# Patient Record
Sex: Female | Born: 1945 | Race: White | Hispanic: No | Marital: Single | Smoking: Former smoker
Health system: Southern US, Community
[De-identification: ages and names within clinical notes are randomized; demographics above are authoritative.]

## PROBLEM LIST (undated history)

## (undated) DIAGNOSIS — E785 Hyperlipidemia, unspecified: Secondary | ICD-10-CM

## (undated) DIAGNOSIS — K2270 Barrett's esophagus without dysplasia: Secondary | ICD-10-CM

## (undated) HISTORY — DX: Barrett's esophagus without dysplasia: K22.70

## (undated) HISTORY — DX: Hyperlipidemia, unspecified: E78.5

---

## 1998-05-20 HISTORY — PX: TOTAL HIP ARTHROPLASTY: SHX124

## 2015-06-27 DIAGNOSIS — D3130 Benign neoplasm of unspecified choroid: Secondary | ICD-10-CM | POA: Diagnosis not present

## 2015-06-27 DIAGNOSIS — H18419 Arcus senilis, unspecified eye: Secondary | ICD-10-CM | POA: Diagnosis not present

## 2015-06-27 DIAGNOSIS — H259 Unspecified age-related cataract: Secondary | ICD-10-CM | POA: Diagnosis not present

## 2015-10-27 DIAGNOSIS — F5101 Primary insomnia: Secondary | ICD-10-CM | POA: Diagnosis not present

## 2015-10-27 DIAGNOSIS — Z1211 Encounter for screening for malignant neoplasm of colon: Secondary | ICD-10-CM | POA: Diagnosis not present

## 2015-10-27 DIAGNOSIS — E785 Hyperlipidemia, unspecified: Secondary | ICD-10-CM | POA: Diagnosis not present

## 2015-10-27 DIAGNOSIS — Z139 Encounter for screening, unspecified: Secondary | ICD-10-CM | POA: Diagnosis not present

## 2015-10-27 DIAGNOSIS — E663 Overweight: Secondary | ICD-10-CM | POA: Diagnosis not present

## 2015-10-27 DIAGNOSIS — M150 Primary generalized (osteo)arthritis: Secondary | ICD-10-CM | POA: Diagnosis not present

## 2015-10-27 DIAGNOSIS — M949 Disorder of cartilage, unspecified: Secondary | ICD-10-CM | POA: Diagnosis not present

## 2015-10-27 DIAGNOSIS — M899 Disorder of bone, unspecified: Secondary | ICD-10-CM | POA: Diagnosis not present

## 2015-10-27 DIAGNOSIS — Z23 Encounter for immunization: Secondary | ICD-10-CM | POA: Diagnosis not present

## 2015-12-08 DIAGNOSIS — M899 Disorder of bone, unspecified: Secondary | ICD-10-CM | POA: Diagnosis not present

## 2015-12-08 DIAGNOSIS — Z13820 Encounter for screening for osteoporosis: Secondary | ICD-10-CM | POA: Diagnosis not present

## 2015-12-08 DIAGNOSIS — Z1231 Encounter for screening mammogram for malignant neoplasm of breast: Secondary | ICD-10-CM | POA: Diagnosis not present

## 2015-12-08 DIAGNOSIS — Z780 Asymptomatic menopausal state: Secondary | ICD-10-CM | POA: Diagnosis not present

## 2015-12-08 DIAGNOSIS — M949 Disorder of cartilage, unspecified: Secondary | ICD-10-CM | POA: Diagnosis not present

## 2016-01-31 DIAGNOSIS — K2270 Barrett's esophagus without dysplasia: Secondary | ICD-10-CM | POA: Diagnosis not present

## 2016-01-31 DIAGNOSIS — Z1211 Encounter for screening for malignant neoplasm of colon: Secondary | ICD-10-CM | POA: Diagnosis not present

## 2016-02-07 DIAGNOSIS — Z0000 Encounter for general adult medical examination without abnormal findings: Secondary | ICD-10-CM | POA: Diagnosis not present

## 2016-02-07 DIAGNOSIS — K2950 Unspecified chronic gastritis without bleeding: Secondary | ICD-10-CM | POA: Diagnosis not present

## 2016-02-07 DIAGNOSIS — Z1211 Encounter for screening for malignant neoplasm of colon: Secondary | ICD-10-CM | POA: Diagnosis not present

## 2016-02-07 DIAGNOSIS — K649 Unspecified hemorrhoids: Secondary | ICD-10-CM | POA: Diagnosis not present

## 2016-02-07 DIAGNOSIS — K2270 Barrett's esophagus without dysplasia: Secondary | ICD-10-CM | POA: Diagnosis not present

## 2016-02-07 DIAGNOSIS — K635 Polyp of colon: Secondary | ICD-10-CM | POA: Diagnosis not present

## 2016-02-07 DIAGNOSIS — D125 Benign neoplasm of sigmoid colon: Secondary | ICD-10-CM | POA: Diagnosis not present

## 2016-02-07 DIAGNOSIS — K5730 Diverticulosis of large intestine without perforation or abscess without bleeding: Secondary | ICD-10-CM | POA: Diagnosis not present

## 2016-02-07 HISTORY — PX: COLONOSCOPY WITH ESOPHAGOGASTRODUODENOSCOPY (EGD): SHX5779

## 2016-03-26 DIAGNOSIS — L821 Other seborrheic keratosis: Secondary | ICD-10-CM | POA: Diagnosis not present

## 2016-03-26 DIAGNOSIS — D692 Other nonthrombocytopenic purpura: Secondary | ICD-10-CM | POA: Diagnosis not present

## 2016-03-26 DIAGNOSIS — D2371 Other benign neoplasm of skin of right lower limb, including hip: Secondary | ICD-10-CM | POA: Diagnosis not present

## 2016-03-26 DIAGNOSIS — D1801 Hemangioma of skin and subcutaneous tissue: Secondary | ICD-10-CM | POA: Diagnosis not present

## 2016-03-26 DIAGNOSIS — L570 Actinic keratosis: Secondary | ICD-10-CM | POA: Diagnosis not present

## 2016-03-26 DIAGNOSIS — D225 Melanocytic nevi of trunk: Secondary | ICD-10-CM | POA: Diagnosis not present

## 2016-05-03 DIAGNOSIS — E782 Mixed hyperlipidemia: Secondary | ICD-10-CM | POA: Diagnosis not present

## 2016-05-03 DIAGNOSIS — Z23 Encounter for immunization: Secondary | ICD-10-CM | POA: Diagnosis not present

## 2016-05-03 DIAGNOSIS — E663 Overweight: Secondary | ICD-10-CM | POA: Diagnosis not present

## 2016-05-03 DIAGNOSIS — Z0000 Encounter for general adult medical examination without abnormal findings: Secondary | ICD-10-CM | POA: Diagnosis not present

## 2016-05-03 DIAGNOSIS — M150 Primary generalized (osteo)arthritis: Secondary | ICD-10-CM | POA: Diagnosis not present

## 2016-05-03 DIAGNOSIS — Z780 Asymptomatic menopausal state: Secondary | ICD-10-CM | POA: Diagnosis not present

## 2016-05-03 DIAGNOSIS — E7800 Pure hypercholesterolemia, unspecified: Secondary | ICD-10-CM | POA: Diagnosis not present

## 2016-05-03 DIAGNOSIS — Z1211 Encounter for screening for malignant neoplasm of colon: Secondary | ICD-10-CM | POA: Diagnosis not present

## 2016-07-02 DIAGNOSIS — H259 Unspecified age-related cataract: Secondary | ICD-10-CM | POA: Diagnosis not present

## 2016-07-02 DIAGNOSIS — D3130 Benign neoplasm of unspecified choroid: Secondary | ICD-10-CM | POA: Diagnosis not present

## 2016-08-07 DIAGNOSIS — H527 Unspecified disorder of refraction: Secondary | ICD-10-CM | POA: Diagnosis not present

## 2016-08-07 DIAGNOSIS — H25813 Combined forms of age-related cataract, bilateral: Secondary | ICD-10-CM | POA: Diagnosis not present

## 2016-08-07 DIAGNOSIS — H52203 Unspecified astigmatism, bilateral: Secondary | ICD-10-CM | POA: Diagnosis not present

## 2016-08-07 DIAGNOSIS — D3132 Benign neoplasm of left choroid: Secondary | ICD-10-CM | POA: Diagnosis not present

## 2016-09-11 DIAGNOSIS — H527 Unspecified disorder of refraction: Secondary | ICD-10-CM | POA: Diagnosis not present

## 2016-09-11 DIAGNOSIS — D3132 Benign neoplasm of left choroid: Secondary | ICD-10-CM | POA: Diagnosis not present

## 2016-09-11 DIAGNOSIS — H25813 Combined forms of age-related cataract, bilateral: Secondary | ICD-10-CM | POA: Diagnosis not present

## 2016-09-11 DIAGNOSIS — H52203 Unspecified astigmatism, bilateral: Secondary | ICD-10-CM | POA: Diagnosis not present

## 2016-09-18 DIAGNOSIS — F430 Acute stress reaction: Secondary | ICD-10-CM | POA: Diagnosis not present

## 2016-09-18 DIAGNOSIS — E663 Overweight: Secondary | ICD-10-CM | POA: Diagnosis not present

## 2016-09-18 DIAGNOSIS — Z780 Asymptomatic menopausal state: Secondary | ICD-10-CM | POA: Diagnosis not present

## 2016-09-18 DIAGNOSIS — F411 Generalized anxiety disorder: Secondary | ICD-10-CM | POA: Diagnosis not present

## 2016-09-18 DIAGNOSIS — E782 Mixed hyperlipidemia: Secondary | ICD-10-CM | POA: Diagnosis not present

## 2016-09-24 DIAGNOSIS — H25812 Combined forms of age-related cataract, left eye: Secondary | ICD-10-CM | POA: Diagnosis not present

## 2016-09-24 DIAGNOSIS — K2270 Barrett's esophagus without dysplasia: Secondary | ICD-10-CM | POA: Diagnosis not present

## 2016-09-24 DIAGNOSIS — H2512 Age-related nuclear cataract, left eye: Secondary | ICD-10-CM | POA: Diagnosis not present

## 2016-09-24 DIAGNOSIS — Z79899 Other long term (current) drug therapy: Secondary | ICD-10-CM | POA: Diagnosis not present

## 2016-09-24 DIAGNOSIS — E785 Hyperlipidemia, unspecified: Secondary | ICD-10-CM | POA: Diagnosis not present

## 2016-09-24 DIAGNOSIS — H52222 Regular astigmatism, left eye: Secondary | ICD-10-CM | POA: Diagnosis not present

## 2016-09-24 DIAGNOSIS — Z87891 Personal history of nicotine dependence: Secondary | ICD-10-CM | POA: Diagnosis not present

## 2016-09-24 DIAGNOSIS — M1990 Unspecified osteoarthritis, unspecified site: Secondary | ICD-10-CM | POA: Diagnosis not present

## 2016-09-25 DIAGNOSIS — Z961 Presence of intraocular lens: Secondary | ICD-10-CM | POA: Diagnosis not present

## 2016-09-25 DIAGNOSIS — H25812 Combined forms of age-related cataract, left eye: Secondary | ICD-10-CM | POA: Diagnosis not present

## 2016-09-25 DIAGNOSIS — H52202 Unspecified astigmatism, left eye: Secondary | ICD-10-CM | POA: Diagnosis not present

## 2016-09-25 DIAGNOSIS — D3132 Benign neoplasm of left choroid: Secondary | ICD-10-CM | POA: Diagnosis not present

## 2016-10-08 DIAGNOSIS — H25011 Cortical age-related cataract, right eye: Secondary | ICD-10-CM | POA: Diagnosis not present

## 2016-10-08 DIAGNOSIS — H2511 Age-related nuclear cataract, right eye: Secondary | ICD-10-CM | POA: Diagnosis not present

## 2016-10-08 DIAGNOSIS — H52221 Regular astigmatism, right eye: Secondary | ICD-10-CM | POA: Diagnosis not present

## 2016-10-08 DIAGNOSIS — Z87891 Personal history of nicotine dependence: Secondary | ICD-10-CM | POA: Diagnosis not present

## 2016-10-08 DIAGNOSIS — H25811 Combined forms of age-related cataract, right eye: Secondary | ICD-10-CM | POA: Diagnosis not present

## 2016-10-08 DIAGNOSIS — K219 Gastro-esophageal reflux disease without esophagitis: Secondary | ICD-10-CM | POA: Diagnosis not present

## 2016-10-08 DIAGNOSIS — E785 Hyperlipidemia, unspecified: Secondary | ICD-10-CM | POA: Diagnosis not present

## 2016-10-08 DIAGNOSIS — Z79899 Other long term (current) drug therapy: Secondary | ICD-10-CM | POA: Diagnosis not present

## 2016-10-09 DIAGNOSIS — Z961 Presence of intraocular lens: Secondary | ICD-10-CM | POA: Diagnosis not present

## 2016-10-09 DIAGNOSIS — H527 Unspecified disorder of refraction: Secondary | ICD-10-CM | POA: Diagnosis not present

## 2016-10-09 DIAGNOSIS — D3132 Benign neoplasm of left choroid: Secondary | ICD-10-CM | POA: Diagnosis not present

## 2016-10-21 DIAGNOSIS — H2000 Unspecified acute and subacute iridocyclitis: Secondary | ICD-10-CM | POA: Diagnosis not present

## 2016-10-21 DIAGNOSIS — Z961 Presence of intraocular lens: Secondary | ICD-10-CM | POA: Diagnosis not present

## 2016-10-21 DIAGNOSIS — D3132 Benign neoplasm of left choroid: Secondary | ICD-10-CM | POA: Diagnosis not present

## 2016-10-21 DIAGNOSIS — H527 Unspecified disorder of refraction: Secondary | ICD-10-CM | POA: Diagnosis not present

## 2016-12-16 DIAGNOSIS — C44329 Squamous cell carcinoma of skin of other parts of face: Secondary | ICD-10-CM | POA: Diagnosis not present

## 2016-12-16 DIAGNOSIS — L814 Other melanin hyperpigmentation: Secondary | ICD-10-CM | POA: Diagnosis not present

## 2016-12-16 DIAGNOSIS — Z08 Encounter for follow-up examination after completed treatment for malignant neoplasm: Secondary | ICD-10-CM | POA: Diagnosis not present

## 2016-12-16 DIAGNOSIS — Z85828 Personal history of other malignant neoplasm of skin: Secondary | ICD-10-CM | POA: Diagnosis not present

## 2016-12-16 DIAGNOSIS — L570 Actinic keratosis: Secondary | ICD-10-CM | POA: Diagnosis not present

## 2016-12-16 DIAGNOSIS — L578 Other skin changes due to chronic exposure to nonionizing radiation: Secondary | ICD-10-CM | POA: Diagnosis not present

## 2016-12-16 DIAGNOSIS — D485 Neoplasm of uncertain behavior of skin: Secondary | ICD-10-CM | POA: Diagnosis not present

## 2017-01-28 DIAGNOSIS — C44329 Squamous cell carcinoma of skin of other parts of face: Secondary | ICD-10-CM | POA: Diagnosis not present

## 2017-01-28 DIAGNOSIS — C44321 Squamous cell carcinoma of skin of nose: Secondary | ICD-10-CM | POA: Diagnosis not present

## 2017-03-26 DIAGNOSIS — Z23 Encounter for immunization: Secondary | ICD-10-CM | POA: Diagnosis not present

## 2017-03-26 DIAGNOSIS — E782 Mixed hyperlipidemia: Secondary | ICD-10-CM | POA: Diagnosis not present

## 2017-03-26 DIAGNOSIS — F5101 Primary insomnia: Secondary | ICD-10-CM | POA: Diagnosis not present

## 2017-03-26 DIAGNOSIS — F4322 Adjustment disorder with anxiety: Secondary | ICD-10-CM | POA: Diagnosis not present

## 2017-03-26 DIAGNOSIS — M150 Primary generalized (osteo)arthritis: Secondary | ICD-10-CM | POA: Diagnosis not present

## 2017-07-15 DIAGNOSIS — Z1231 Encounter for screening mammogram for malignant neoplasm of breast: Secondary | ICD-10-CM | POA: Diagnosis not present

## 2017-08-13 DIAGNOSIS — F4322 Adjustment disorder with anxiety: Secondary | ICD-10-CM | POA: Diagnosis not present

## 2017-08-13 DIAGNOSIS — E663 Overweight: Secondary | ICD-10-CM | POA: Diagnosis not present

## 2017-08-13 DIAGNOSIS — R05 Cough: Secondary | ICD-10-CM | POA: Diagnosis not present

## 2017-08-13 DIAGNOSIS — J301 Allergic rhinitis due to pollen: Secondary | ICD-10-CM | POA: Diagnosis not present

## 2017-08-19 DIAGNOSIS — D485 Neoplasm of uncertain behavior of skin: Secondary | ICD-10-CM | POA: Diagnosis not present

## 2017-08-19 DIAGNOSIS — Z85828 Personal history of other malignant neoplasm of skin: Secondary | ICD-10-CM | POA: Diagnosis not present

## 2017-08-19 DIAGNOSIS — D1801 Hemangioma of skin and subcutaneous tissue: Secondary | ICD-10-CM | POA: Diagnosis not present

## 2017-08-19 DIAGNOSIS — L821 Other seborrheic keratosis: Secondary | ICD-10-CM | POA: Diagnosis not present

## 2017-08-19 DIAGNOSIS — L570 Actinic keratosis: Secondary | ICD-10-CM | POA: Diagnosis not present

## 2017-08-19 DIAGNOSIS — Z08 Encounter for follow-up examination after completed treatment for malignant neoplasm: Secondary | ICD-10-CM | POA: Diagnosis not present

## 2017-08-19 DIAGNOSIS — C44519 Basal cell carcinoma of skin of other part of trunk: Secondary | ICD-10-CM | POA: Diagnosis not present

## 2017-08-19 DIAGNOSIS — L578 Other skin changes due to chronic exposure to nonionizing radiation: Secondary | ICD-10-CM | POA: Diagnosis not present

## 2017-12-02 DIAGNOSIS — E782 Mixed hyperlipidemia: Secondary | ICD-10-CM | POA: Diagnosis not present

## 2017-12-02 DIAGNOSIS — H9313 Tinnitus, bilateral: Secondary | ICD-10-CM | POA: Diagnosis not present

## 2017-12-02 DIAGNOSIS — K2270 Barrett's esophagus without dysplasia: Secondary | ICD-10-CM | POA: Diagnosis not present

## 2017-12-02 DIAGNOSIS — Z79890 Hormone replacement therapy (postmenopausal): Secondary | ICD-10-CM | POA: Diagnosis not present

## 2017-12-02 DIAGNOSIS — Z0000 Encounter for general adult medical examination without abnormal findings: Secondary | ICD-10-CM | POA: Diagnosis not present

## 2017-12-02 DIAGNOSIS — E663 Overweight: Secondary | ICD-10-CM | POA: Diagnosis not present

## 2017-12-02 DIAGNOSIS — L989 Disorder of the skin and subcutaneous tissue, unspecified: Secondary | ICD-10-CM | POA: Diagnosis not present

## 2017-12-02 DIAGNOSIS — K219 Gastro-esophageal reflux disease without esophagitis: Secondary | ICD-10-CM | POA: Diagnosis not present

## 2018-01-05 DIAGNOSIS — H9313 Tinnitus, bilateral: Secondary | ICD-10-CM | POA: Diagnosis not present

## 2018-01-05 DIAGNOSIS — H6122 Impacted cerumen, left ear: Secondary | ICD-10-CM | POA: Diagnosis not present

## 2018-01-05 DIAGNOSIS — H903 Sensorineural hearing loss, bilateral: Secondary | ICD-10-CM | POA: Diagnosis not present

## 2018-01-16 DIAGNOSIS — Z01419 Encounter for gynecological examination (general) (routine) without abnormal findings: Secondary | ICD-10-CM | POA: Diagnosis not present

## 2018-01-30 ENCOUNTER — Telehealth: Payer: Self-pay

## 2018-01-30 NOTE — Telephone Encounter (Signed)
Copied from [place] [id number]. Topic: Appointment Scheduling - New Patient >> Jan 30, 2018 12:30 PM Berneta Levins wrote: New patient has been scheduled for your office. Provider: Dr. Nani Ravens Date of Appointment: 03/02/18  Route to department's PEC pool.

## 2018-01-30 NOTE — Telephone Encounter (Signed)
Copied from [place] [id number]. Topic: Appointment Scheduling - New Patient >> Jan 30, 2018 12:30 PM Berneta Levins wrote: New patient has been scheduled for your office. Provider: Dr. Nani Ravens Date of Appointment: 03/02/18  Route to department's PEC pool.

## 2018-02-24 DIAGNOSIS — D1801 Hemangioma of skin and subcutaneous tissue: Secondary | ICD-10-CM | POA: Diagnosis not present

## 2018-02-24 DIAGNOSIS — L820 Inflamed seborrheic keratosis: Secondary | ICD-10-CM | POA: Diagnosis not present

## 2018-02-24 DIAGNOSIS — Z08 Encounter for follow-up examination after completed treatment for malignant neoplasm: Secondary | ICD-10-CM | POA: Diagnosis not present

## 2018-02-24 DIAGNOSIS — L814 Other melanin hyperpigmentation: Secondary | ICD-10-CM | POA: Diagnosis not present

## 2018-02-24 DIAGNOSIS — L578 Other skin changes due to chronic exposure to nonionizing radiation: Secondary | ICD-10-CM | POA: Diagnosis not present

## 2018-02-24 DIAGNOSIS — L821 Other seborrheic keratosis: Secondary | ICD-10-CM | POA: Diagnosis not present

## 2018-02-24 DIAGNOSIS — Z85828 Personal history of other malignant neoplasm of skin: Secondary | ICD-10-CM | POA: Diagnosis not present

## 2018-02-24 DIAGNOSIS — D239 Other benign neoplasm of skin, unspecified: Secondary | ICD-10-CM | POA: Diagnosis not present

## 2018-02-24 DIAGNOSIS — D235 Other benign neoplasm of skin of trunk: Secondary | ICD-10-CM | POA: Diagnosis not present

## 2018-03-02 ENCOUNTER — Ambulatory Visit: Payer: Self-pay | Admitting: Family Medicine

## 2018-03-04 ENCOUNTER — Ambulatory Visit: Payer: Self-pay | Admitting: Family Medicine

## 2018-03-06 ENCOUNTER — Ambulatory Visit (INDEPENDENT_AMBULATORY_CARE_PROVIDER_SITE_OTHER): Payer: PPO | Admitting: Family Medicine

## 2018-03-06 ENCOUNTER — Encounter: Payer: Self-pay | Admitting: Family Medicine

## 2018-03-06 VITALS — BP 120/80 | HR 74 | Temp 98.4°F | Ht 62.0 in | Wt 153.0 lb

## 2018-03-06 DIAGNOSIS — Z23 Encounter for immunization: Secondary | ICD-10-CM | POA: Diagnosis not present

## 2018-03-06 DIAGNOSIS — K22719 Barrett's esophagus with dysplasia, unspecified: Secondary | ICD-10-CM | POA: Insufficient documentation

## 2018-03-06 DIAGNOSIS — K2270 Barrett's esophagus without dysplasia: Secondary | ICD-10-CM | POA: Diagnosis not present

## 2018-03-06 DIAGNOSIS — E785 Hyperlipidemia, unspecified: Secondary | ICD-10-CM | POA: Diagnosis not present

## 2018-03-06 DIAGNOSIS — G4700 Insomnia, unspecified: Secondary | ICD-10-CM | POA: Diagnosis not present

## 2018-03-06 NOTE — Progress Notes (Signed)
Pre visit review using our clinic review tool, if applicable. No additional management support is needed unless otherwise documented below in the visit note. 

## 2018-03-06 NOTE — Progress Notes (Signed)
Chief Complaint  Patient presents with  . New Patient (Initial Visit)       New Patient Visit SUBJECTIVE: HPI: Vickie Lopez is an 72 y.o.female who is being seen for establishing care.  The patient was previously seen at [facility].  Hyperlipidemia Patient presents for dyslipidemia follow up. Currently being treated with Pravastatin 60 mg/d and compliance with treatment thus far has been good. She complains of myalgias. She is adhering to a healthy. Exercise: golfing, walking dog The patient is not known to have coexisting coronary artery disease.   Patient has a history of insomnia.  She used to be on Ambien but her previous provider changed her to trazodone.  She takes this sparingly.  She normally takes a [employer] on a nightly basis and sleeps very well.  She does not have any side effects or daytime drowsiness.  Patient has a history of Barrett's esophagus.  She takes omeprazole 20 mg daily for this issue.  She thinks she may have been told to take it twice daily in the past.  She is not having any reflux symptoms.  Her last scope was in 2017 with no dysplasia noted.  3-year follow-up was recommended.  No Known Allergies  Past Medical History:  Diagnosis Date  . Barrett's esophagus   . Hyperlipidemia    Past Surgical History:  Procedure Laterality Date  . TOTAL HIP ARTHROPLASTY Left 2000   Family History  Problem Relation Age of Onset  . Heart attack Mother   . Heart attack Father   . Cancer Sister        esophageal cancer   No Known Allergies  Current Outpatient Medications:  .  estradiol (ESTRACE) 2 MG tablet, Take 2 mg by mouth daily., Disp: , Rfl:  .  medroxyPROGESTERone (PROVERA) 2.5 MG tablet, Take 2.5 mg by mouth daily., Disp: , Rfl:  .  omeprazole (PRILOSEC) 20 MG capsule, Take 1 capsule (20 mg total) by mouth 2 (two) times daily before a meal., Disp: , Rfl:  .  pravastatin (PRAVACHOL) 20 MG tablet, Take 2 tablets (40 mg total) by mouth  daily., Disp: , Rfl:  .  traZODone (DESYREL) 50 MG tablet, Take 50 mg by mouth at bedtime as needed for sleep., Disp: , Rfl:    ROS Cardiovascular: Denies chest pain  Respiratory: Denies dyspnea   OBJECTIVE: BP 120/80 (BP Location: Left Arm, Patient Position: Sitting, Cuff Size: Normal)   Pulse 74   Temp 98.4 F (36.9 C) (Oral)   Ht 5\' 2"  (1.575 m)   Wt 153 lb (69.4 kg)   SpO2 98%   BMI 27.98 kg/m   Constitutional: -  VS reviewed -  Well developed, well nourished, appears stated age -  No apparent distress  Psychiatric: -  Oriented to person, place, and time -  Memory intact -  Affect and mood normal -  Fluent conversation, good eye contact -  Judgment and insight age appropriate  Eye: -  Conjunctivae clear, no discharge -  Pupils symmetric, round, reactive to light  ENMT: -  MMM    Pharynx moist, no exudate, no erythema  Neck: -  No gross swelling, no palpable masses -  Thyroid midline, not enlarged, mobile, no palpable masses  Cardiovascular: -  RRR -  No LE edema  Respiratory: -  Normal respiratory effort, no accessory muscle use, no retraction -  Breath sounds equal, no wheezes, no ronchi, no crackles  Gastrointestinal: -  Bowel sounds normal -  No tenderness, no distention, no guarding, no masses  Skin: -  No significant lesion on inspection -  Warm and dry to palpation   ASSESSMENT/PLAN: Insomnia, unspecified type  Hyperlipidemia, unspecified hyperlipidemia type  Barrett's esophagus without dysplasia  Need for influenza vaccination - Plan: Flu vaccine HIGH DOSE PF (Fluzone High dose)  Patient instructed to sign release of records form from her previous PCP. Continue trazodone, will avoid Ambien. Pravastatin 40 mg is adequate.  We will recheck labs at next visit.  Counseled on diet and exercise. Reviewed records and see that she is due for follow-up in 2020.  Recommended she take omeprazole twice daily. Patient should return in 6 months for med check. The  patient voiced understanding and agreement to the plan.   [place], DO 03/06/18  2:29 PM

## 2018-03-06 NOTE — Patient Instructions (Signed)
Stay active, keep the diet clean.  Keep up the good work.   Let us know if you need anything.

## 2018-03-20 ENCOUNTER — Telehealth: Payer: Self-pay | Admitting: Family Medicine

## 2018-03-20 NOTE — Telephone Encounter (Signed)
Reviewed records. Updated imms. Due for tetanus, has never had Shingrix.

## 2018-04-02 ENCOUNTER — Encounter: Payer: Self-pay | Admitting: Family Medicine

## 2018-04-02 ENCOUNTER — Ambulatory Visit (INDEPENDENT_AMBULATORY_CARE_PROVIDER_SITE_OTHER): Payer: PPO | Admitting: Family Medicine

## 2018-04-02 VITALS — BP 120/82 | HR 79 | Temp 98.0°F | Ht 62.5 in | Wt 158.4 lb

## 2018-04-02 DIAGNOSIS — S46812A Strain of other muscles, fascia and tendons at shoulder and upper arm level, left arm, initial encounter: Secondary | ICD-10-CM | POA: Diagnosis not present

## 2018-04-02 NOTE — Progress Notes (Signed)
Pre visit review using our clinic review tool, if applicable. No additional management support is needed unless otherwise documented below in the visit note. 

## 2018-04-02 NOTE — Patient Instructions (Addendum)
Heat (pad or rice pillow in microwave) over affected area, 10-15 minutes twice daily.   Ice/cold pack over area for 10-15 min twice daily.  OK to take Tylenol 1000 mg (2 extra strength tabs) or 975 mg (3 regular strength tabs) every 6 hours as needed.  Ibuprofen 400-600 mg (2-3 over the counter strength tabs) every 6 hours as needed for pain.  Contact your pharmacy regarding the Shingrix (shingles vaccine).   Trapezius stretches/exercises Do exercises exactly as told by your health care provider and adjust them as directed. It is normal to feel mild stretching, pulling, tightness, or discomfort as you do these exercises, but you should stop right away if you feel sudden pain or your pain gets worse.  Stretching and range of motion exercises These exercises warm up your muscles and joints and improve the movement and flexibility of your shoulder. These exercises can also help to relieve pain, numbness, and tingling. If you are unable to do any of the following for any reason, do not further attempt to do it.   Exercise A: Flexion, standing    1. Stand and hold a broomstick, a cane, or a similar object. Place your hands a little more than shoulder-width apart on the object. Your left / right hand should be palm-up, and your other hand should be palm-down. 2. Push the stick to raise your left / right arm out to your side and then over your head. Use your other hand to help move the stick. Stop when you feel a stretch in your shoulder, or when you reach the angle that is recommended by your health care provider. ? Avoid shrugging your shoulder while you raise your arm. Keep your shoulder blade tucked down toward your spine. 3. Hold for 30 seconds. 4. Slowly return to the starting position. Repeat 2 times. Complete this exercise 3 times per week.  Exercise B: Abduction, supine    1. Lie on your back and hold a broomstick, a cane, or a similar object. Place your hands a little more than  shoulder-width apart on the object. Your left / right hand should be palm-up, and your other hand should be palm-down. 2. Push the stick to raise your left / right arm out to your side and then over your head. Use your other hand to help move the stick. Stop when you feel a stretch in your shoulder, or when you reach the angle that is recommended by your health care provider. ? Avoid shrugging your shoulder while you raise your arm. Keep your shoulder blade tucked down toward your spine. 3. Hold for 30 seconds. 4. Slowly return to the starting position. Repeat 2 times. Complete this exercise 3 times per week.  Exercise C: Flexion, active-assisted    1. Lie on your back. You may bend your knees for comfort. 2. Hold a broomstick, a cane, or a similar object. Place your hands about shoulder-width apart on the object. Your palms should face toward your feet. 3. Raise the stick and move your arms over your head and behind your head, toward the floor. Use your healthy arm to help your left / right arm move farther. Stop when you feel a gentle stretch in your shoulder, or when you reach the angle where your health care provider tells you to stop. 4. Hold for 30 seconds. 5. Slowly return to the starting position. Repeat 2 times. Complete this exercise 3 times per week.  Exercise D: External rotation and abduction    1.  Stand in a door frame with one of your feet slightly in front of the other. This is called a staggered stance. 2. Choose one of the following positions as told by your health care provider: ? Place your hands and forearms on the door frame above your head. ? Place your hands and forearms on the door frame at the height of your head. ? Place your hands on the door frame at the height of your elbows. 3. Slowly move your weight onto your front foot until you feel a stretch across your chest and in the front of your shoulders. Keep your head and chest upright and keep your abdominal  muscles tight. 4. Hold for 30 seconds. 5. To release the stretch, shift your weight to your back foot. Repeat 2 times. Complete this stretch 3 times per week.  Strengthening exercises These exercises build strength and endurance in your shoulder. Endurance is the ability to use your muscles for a long time, even after your muscles get tired. Exercise E: Scapular depression and adduction  1. Sit on a stable chair. Support your arms in front of you with pillows, armrests, or a tabletop. Keep your elbows in line with the sides of your body. 2. Gently move your shoulder blades down toward your middle back. Relax the muscles on the tops of your shoulders and in the back of your neck. 3. Hold for 3 seconds. 4. Slowly release the tension and relax your muscles completely before doing this exercise again. Repeat for a total of 10 repetitions. 5. After you have practiced this exercise, try doing the exercise without the arm support. Then, try the exercise while standing instead of sitting. Repeat 2 times. Complete this exercise 3 times per week.  Exercise F: Shoulder abduction, isometric    1. Stand or sit about 4-6 inches (10-15 cm) from a wall with your left / right side facing the wall. 2. Bend your left / right elbow and gently press your elbow against the wall. 3. Increase the pressure slowly until you are pressing as hard as you can without shrugging your shoulder. 4. Hold for 3 seconds. 5. Slowly release the tension and relax your muscles completely. Repeat for a total of 10 repetitions. Repeat 2 times. Complete this exercise 3 times per week.  Exercise G: Shoulder flexion, isometric    1. Stand or sit about 4-6 inches (10-15 cm) away from a wall with your left / right side facing the wall. 2. Keep your left / right elbow straight and gently press the top of your fist against the wall. Increase the pressure slowly until you are pressing as hard as you can without shrugging your  shoulder. 3. Hold for 10-15 seconds. 4. Slowly release the tension and relax your muscles completely. Repeat for a total of 10 repetitions. Repeat 2 times. Complete this exercise 3 times per week.  Exercise H: Internal rotation    1. Sit in a stable chair without armrests, or stand. Secure an exercise band at your left / right side, at elbow height. 2. Place a soft object, such as a folded towel or a small pillow, under your left / right upper arm so your elbow is a few inches (about 8 cm) away from your side. 3. Hold the end of the exercise band so the band stretches. 4. Keeping your elbow pressed against the soft object under your arm, move your forearm across your body toward your abdomen. Keep your body steady so the movement is  only coming from your shoulder. 5. Hold for 3 seconds. 6. Slowly return to the starting position. Repeat for a total of 10 repetitions. Repeat 2 times. Complete this exercise 3 times per week.  Exercise I: External rotation    1. Sit in a stable chair without armrests, or stand. 2. Secure an exercise band at your left / right side, at elbow height. 3. Place a soft object, such as a folded towel or a small pillow, under your left / right upper arm so your elbow is a few inches (about 8 cm) away from your side. 4. Hold the end of the exercise band so the band stretches. 5. Keeping your elbow pressed against the soft object under your arm, move your forearm out, away from your abdomen. Keep your body steady so the movement is only coming from your shoulder. 6. Hold for 3 seconds. 7. Slowly return to the starting position. Repeat for a total of 10 repetitions. Repeat 2 times. Complete this exercise 3 times per week. Exercise J: Shoulder extension  1. Sit in a stable chair without armrests, or stand. Secure an exercise band to a stable object in front of you so the band is at shoulder height. 2. Hold one end of the exercise band in each hand. Your palms should  face each other. 3. Straighten your elbows and lift your hands up to shoulder height. 4. Step back, away from the secured end of the exercise band, until the band stretches. 5. Squeeze your shoulder blades together and pull your hands down to the sides of your thighs. Stop when your hands are straight down by your sides. Do not let your hands go behind your body. 6. Hold for 3 seconds. 7. Slowly return to the starting position. Repeat for a total of 10 repetitions. Repeat 2 times. Complete this exercise 3 times per week.  Exercise K: Shoulder extension, prone    1. Lie on your abdomen on a firm surface so your left / right arm hangs over the edge. 2. Hold a 5 lb weight in your hand so your palm faces in toward your body. Your arm should be straight. 3. Squeeze your shoulder blade down toward the middle of your back. 4. Slowly raise your arm behind you, up to the height of the surface that you are lying on. Keep your arm straight. 5. Hold for 3 seconds. 6. Slowly return to the starting position and relax your muscles. Repeat for a total of 10 repetitions. Repeat 2 times. Complete this exercise 3 times per week.   Exercise L: Horizontal abduction, prone  1. Lie on your abdomen on a firm surface so your left / right arm hangs over the edge. 2. Hold a 5 lb weight in your hand so your palm faces toward your feet. Your arm should be straight. 3. Squeeze your shoulder blade down toward the middle of your back. 4. Bend your elbow so your hand moves up, until your elbow is bent to an "L" shape (90 degrees). With your elbow bent, slowly move your forearm forward and up. Raise your hand up to the height of the surface that you are lying on. ? Your upper arm should not move, and your elbow should stay bent. ? At the top of the movement, your palm should face the floor. 5. Hold for 3 seconds. 6. Slowly return to the starting position and relax your muscles. Repeat for a total of 10  repetitions. Repeat 2 times. Complete this exercise  3 times per week.  Exercise M: Horizontal abduction, standing  1. Sit on a stable chair, or stand. 2. Secure an exercise band to a stable object in front of you so the band is at shoulder height. 3. Hold one end of the exercise band in each hand. 4. Straighten your elbows and lift your hands straight in front of you, up to shoulder height. Your palms should face down, toward the floor. 5. Step back, away from the secured end of the exercise band, until the band stretches. 6. Move your arms out to your sides, and keep your arms straight. 7. Hold for 3 seconds. 8. Slowly return to the starting position. Repeat for a total of 10 repetitions. Repeat 2 times. Complete this exercise 3 times per week.  Exercise N: Scapular retraction and elevation  1. Sit on a stable chair, or stand. 2. Secure an exercise band to a stable object in front of you so the band is at shoulder height. 3. Hold one end of the exercise band in each hand. Your palms should face each other. 4. Sit in a stable chair without armrests, or stand. 5. Step back, away from the secured end of the exercise band, until the band stretches. 6. Squeeze your shoulder blades together and lift your hands over your head. Keep your elbows straight. 7. Hold for 3 seconds. 8. Slowly return to the starting position. Repeat for a total of 10 repetitions. Repeat 2 times. Complete this exercise 3 times per week.  This information is not intended to replace advice given to you by your health care provider. Make sure you discuss any questions you have with your health care provider. Document Released: 05/06/2005 Document Revised: 01/11/2016 Document Reviewed: 03/23/2015 Elsevier Interactive Patient Education  2017 [employer].

## 2018-04-02 NOTE — Progress Notes (Signed)
Musculoskeletal Exam  Patient: Vickie Lopez DOB: [birth year]  DOS: 04/02/2018  SUBJECTIVE:  Chief Complaint:   Chief Complaint  Patient presents with  . Neck Pain    Vickie Lopez is a 72 y.o.  female for evaluation and treatment of R neck/shoulder pain.   Onset:  1 week ago. No inj or change in activity.  Location: L neck/shoulder area Character:  dull  Progression of issue:  is unchanged Associated symptoms: None Treatment: to date has been ice, OTC NSAIDS and heat.   Neurovascular symptoms: no  ROS: Musculoskeletal/Extremities: +L shoulder/neck pain  Past Medical History:  Diagnosis Date  . Barrett's esophagus   . Hyperlipidemia     Objective: VITAL SIGNS: BP 120/82 (BP Location: Right Arm, Patient Position: Sitting, Cuff Size: Normal)   Pulse 79   Temp 98 F (36.7 C) (Oral)   Ht 5' 2.5" (1.588 m)   Wt 158 lb 6 oz (71.8 kg)   SpO2 97%   BMI 28.51 kg/m  Constitutional: Well formed, well developed. No acute distress. Cardiovascular: Brisk cap refill Thorax & Lungs: No accessory muscle use Musculoskeletal: L shoulder.   Normal active range of motion: no.   Normal passive range of motion: no Tenderness to palpation: Yes over trap Deformity: no Ecchymosis: no Neurologic: Normal sensory function. No focal deficits noted. DTR's equal and symmetry in UE's. No clonus. Psychiatric: Normal mood. Age appropriate judgment and insight. Alert & oriented x 3.    Assessment:  Strain of left trapezius muscle, initial encounter  Plan: Heat, Tylenol, ice, NSAIDs, stretches/exercises. F/u in 4 weeks prn. The patient voiced understanding and agreement to the plan.   [place], DO 04/02/18  2:57 PM

## 2018-04-13 ENCOUNTER — Other Ambulatory Visit: Payer: Self-pay | Admitting: Family Medicine

## 2018-04-13 MED ORDER — OMEPRAZOLE 20 MG PO CPDR: 20.0000 mg | DELAYED_RELEASE_CAPSULE | Freq: Two times a day (BID) | ORAL | 3 refills | Status: DC

## 2018-05-26 DIAGNOSIS — D3131 Benign neoplasm of right choroid: Secondary | ICD-10-CM | POA: Diagnosis not present

## 2018-08-30 ENCOUNTER — Other Ambulatory Visit: Payer: Self-pay | Admitting: Family Medicine

## 2018-09-07 ENCOUNTER — Encounter: Payer: Self-pay | Admitting: Family Medicine

## 2018-09-07 ENCOUNTER — Ambulatory Visit: Payer: PPO | Admitting: Family Medicine

## 2018-09-07 ENCOUNTER — Ambulatory Visit (INDEPENDENT_AMBULATORY_CARE_PROVIDER_SITE_OTHER): Payer: PPO | Admitting: Family Medicine

## 2018-09-07 ENCOUNTER — Other Ambulatory Visit: Payer: Self-pay

## 2018-09-07 DIAGNOSIS — K2270 Barrett's esophagus without dysplasia: Secondary | ICD-10-CM | POA: Diagnosis not present

## 2018-09-07 DIAGNOSIS — G4700 Insomnia, unspecified: Secondary | ICD-10-CM | POA: Diagnosis not present

## 2018-09-07 DIAGNOSIS — E785 Hyperlipidemia, unspecified: Secondary | ICD-10-CM | POA: Diagnosis not present

## 2018-09-07 NOTE — Progress Notes (Signed)
CC: Med ck  Subjective: Hyperlipidemia Patient presents for Hyperlipidemia follow up. Currently taking pravastatin 40 mg/d and compliance with treatment thus far has been good. She denies myalgias. She is adhering to a healthy diet. Exercise: walks dog daily The patient is not known to have coexisting coronary artery disease.  Hx of insomnia. Takes trazodone prn. Uses otc [employer] med to sleep and that works fine.  Hx of Barret's esophagus. Takes PPI, no AE's, no reflux s/s's.   ROS: Heart: Denies chest pain Lungs: Denies SOB   Past Medical History:  Diagnosis Date  . Barrett's esophagus   . Hyperlipidemia     Objective: No conversational dyspnea Age appropriate judgment and insight Nml affect and mood  Assessment and Plan: Hyperlipidemia, unspecified hyperlipidemia type - Plan: Comprehensive metabolic panel, Lipid panel  Insomnia, unspecified type  Barrett's esophagus without dysplasia  1- cont statin, ck labs. Counseled on diet and exercise. 2- Cont trazodone prn. OK to use OTC [employer] med. 3- Cont PPI. F/u in 6 mo, fasting labs at earliest convenience. The patient voiced understanding and agreement to the plan.  [place], DO 09/07/18  3:09 PM

## 2018-09-22 ENCOUNTER — Other Ambulatory Visit: Payer: Self-pay | Admitting: Family Medicine

## 2018-12-05 ENCOUNTER — Other Ambulatory Visit: Payer: Self-pay | Admitting: Family Medicine

## 2018-12-17 ENCOUNTER — Other Ambulatory Visit: Payer: Self-pay

## 2019-01-02 ENCOUNTER — Other Ambulatory Visit: Payer: Self-pay | Admitting: Family Medicine

## 2019-01-18 ENCOUNTER — Other Ambulatory Visit: Payer: Self-pay | Admitting: Family Medicine

## 2019-02-16 ENCOUNTER — Telehealth: Payer: Self-pay | Admitting: Family Medicine

## 2019-02-16 NOTE — Telephone Encounter (Signed)
Copied from [place] [id number]. Topic: General - Other >> Feb 16, 2019  1:49 PM Wynetta Emery, [place] C wrote: Reason for CRM: pt called in to ask if she is due for a shingles shot, when did she have it?   Please advise pt  CB: [phone]   There is no previous shingles shot on her records.

## 2019-02-16 NOTE — Telephone Encounter (Signed)
50 years and older. It can make one ill for 48 hrs after shot, so plan accordingly. Ty.

## 2019-02-16 NOTE — Telephone Encounter (Signed)
Called informed the patient of information. Advised of PCP instructions.Marland KitchenMarland KitchenAlso, she would need to get a shingles vaccine from her pharmacy as she has medicare and the cost at our office it too expensive//they prefer pharmacy. The patient verbalized understanding.

## 2019-02-25 DIAGNOSIS — D239 Other benign neoplasm of skin, unspecified: Secondary | ICD-10-CM | POA: Diagnosis not present

## 2019-02-25 DIAGNOSIS — Z85828 Personal history of other malignant neoplasm of skin: Secondary | ICD-10-CM | POA: Diagnosis not present

## 2019-02-25 DIAGNOSIS — L814 Other melanin hyperpigmentation: Secondary | ICD-10-CM | POA: Diagnosis not present

## 2019-02-25 DIAGNOSIS — L578 Other skin changes due to chronic exposure to nonionizing radiation: Secondary | ICD-10-CM | POA: Diagnosis not present

## 2019-02-25 DIAGNOSIS — L570 Actinic keratosis: Secondary | ICD-10-CM | POA: Diagnosis not present

## 2019-02-25 DIAGNOSIS — D1801 Hemangioma of skin and subcutaneous tissue: Secondary | ICD-10-CM | POA: Diagnosis not present

## 2019-02-25 DIAGNOSIS — Z08 Encounter for follow-up examination after completed treatment for malignant neoplasm: Secondary | ICD-10-CM | POA: Diagnosis not present

## 2019-02-25 DIAGNOSIS — D692 Other nonthrombocytopenic purpura: Secondary | ICD-10-CM | POA: Diagnosis not present

## 2019-02-25 DIAGNOSIS — L821 Other seborrheic keratosis: Secondary | ICD-10-CM | POA: Diagnosis not present

## 2019-06-16 ENCOUNTER — Encounter: Payer: Self-pay | Admitting: Family Medicine

## 2019-06-21 ENCOUNTER — Other Ambulatory Visit: Payer: Self-pay | Admitting: Family Medicine

## 2019-06-22 DIAGNOSIS — Z1231 Encounter for screening mammogram for malignant neoplasm of breast: Secondary | ICD-10-CM | POA: Diagnosis not present

## 2019-06-22 DIAGNOSIS — Z1239 Encounter for other screening for malignant neoplasm of breast: Secondary | ICD-10-CM | POA: Diagnosis not present

## 2019-07-30 ENCOUNTER — Other Ambulatory Visit: Payer: Self-pay | Admitting: Family Medicine

## 2019-08-23 ENCOUNTER — Other Ambulatory Visit: Payer: Self-pay

## 2019-08-24 ENCOUNTER — Other Ambulatory Visit: Payer: Self-pay | Admitting: Family Medicine

## 2019-08-24 ENCOUNTER — Ambulatory Visit (INDEPENDENT_AMBULATORY_CARE_PROVIDER_SITE_OTHER): Payer: PPO | Admitting: Family Medicine

## 2019-08-24 ENCOUNTER — Encounter: Payer: Self-pay | Admitting: Family Medicine

## 2019-08-24 VITALS — BP 120/80 | HR 75 | Temp 97.1°F | Ht 62.5 in | Wt 157.2 lb

## 2019-08-24 DIAGNOSIS — K22719 Barrett's esophagus with dysplasia, unspecified: Secondary | ICD-10-CM

## 2019-08-24 DIAGNOSIS — M545 Low back pain, unspecified: Secondary | ICD-10-CM

## 2019-08-24 DIAGNOSIS — R29898 Other symptoms and signs involving the musculoskeletal system: Secondary | ICD-10-CM

## 2019-08-24 DIAGNOSIS — R5383 Other fatigue: Secondary | ICD-10-CM | POA: Diagnosis not present

## 2019-08-24 DIAGNOSIS — E785 Hyperlipidemia, unspecified: Secondary | ICD-10-CM

## 2019-08-24 LAB — CBC
HCT: 35.9 % — ABNORMAL LOW (ref 36.0–46.0)
Hemoglobin: 12.3 g/dL (ref 12.0–15.0)
MCHC: 34.2 g/dL (ref 30.0–36.0)
MCV: 87.8 fl (ref 78.0–100.0)
Platelets: 278 10*3/uL (ref 150.0–400.0)
RBC: 4.09 Mil/uL (ref 3.87–5.11)
RDW: 14.2 % (ref 11.5–15.5)
WBC: 6.8 10*3/uL (ref 4.0–10.5)

## 2019-08-24 LAB — COMPREHENSIVE METABOLIC PANEL
ALT: 7 U/L (ref 0–35)
AST: 10 U/L (ref 0–37)
Albumin: 4.1 g/dL (ref 3.5–5.2)
Alkaline Phosphatase: 51 U/L (ref 39–117)
BUN: 12 mg/dL (ref 6–23)
CO2: 27 mEq/L (ref 19–32)
Calcium: 9.2 mg/dL (ref 8.4–10.5)
Chloride: 106 mEq/L (ref 96–112)
Creatinine, Ser: 0.85 mg/dL (ref 0.40–1.20)
GFR: 65.68 mL/min (ref >60.00)
Glucose, Bld: 82 mg/dL (ref 70–99)
Potassium: 4.3 mEq/L (ref 3.5–5.1)
Sodium: 140 mEq/L (ref 135–145)
Total Bilirubin: 0.5 mg/dL (ref 0.2–1.2)
Total Protein: 6.4 g/dL (ref 6.0–8.3)

## 2019-08-24 LAB — LIPID PANEL
Cholesterol: 224 mg/dL — ABNORMAL HIGH (ref 0–200)
HDL: 67.9 mg/dL (ref >39.00)
LDL Cholesterol: 136 mg/dL — ABNORMAL HIGH (ref 0–99)
NonHDL: 156.44
Total CHOL/HDL Ratio: 3
Triglycerides: 104 mg/dL (ref 0.0–149.0)
VLDL: 20.8 mg/dL (ref 0.0–40.0)

## 2019-08-24 LAB — VITAMIN D 25 HYDROXY (VIT D DEFICIENCY, FRACTURES): VITD: 20.18 ng/mL — ABNORMAL LOW (ref 30.00–100.00)

## 2019-08-24 LAB — TSH: TSH: 4.33 u[IU]/mL (ref 0.35–4.50)

## 2019-08-24 MED ORDER — VITAMIN D (ERGOCALCIFEROL) 1.25 MG (50000 UNIT) PO CAPS: 50000.0000 [IU] | ORAL_CAPSULE | ORAL | 0 refills | Status: DC

## 2019-08-24 NOTE — Patient Instructions (Addendum)
If you do not hear anything about your referral in the next 1-2 weeks, call our office and ask for an update.  If no improvement in your legs, I want to see you again.   Keep the diet clean and stay active.  Try to drink 55-60 oz of water daily.  Give [place] 2-3 business days to get the results of your labs back.   Heat (pad or rice pillow in microwave) over affected area, 10-15 minutes twice daily.   Keep the diet clean and stay active.  EXERCISES  RANGE OF MOTION (ROM) AND STRETCHING EXERCISES - Low Back Pain Most people with lower back pain will find that their symptoms get worse with excessive bending forward (flexion) or arching at the lower back (extension). The exercises that will help resolve your symptoms will focus on the opposite motion.  If you have pain, numbness or tingling which travels down into your buttocks, leg or foot, the goal of the therapy is for these symptoms to move closer to your back and eventually resolve. Sometimes, these leg symptoms will get better, but your lower back pain may worsen. This is often an indication of progress in your rehabilitation. Be very alert to any changes in your symptoms and the activities in which you participated in the 24 hours prior to the change. Sharing this information with your caregiver will allow him or her to most efficiently treat your condition. These exercises may help you when beginning to rehabilitate your injury. Your symptoms may resolve with or without further involvement from your physician, physical therapist or athletic trainer. While completing these exercises, remember:   Restoring tissue flexibility helps normal motion to return to the joints. This allows healthier, less painful movement and activity.  An effective stretch should be held for at least 30 seconds.  A stretch should never be painful. You should only feel a gentle lengthening or release in the stretched tissue. FLEXION RANGE OF MOTION AND STRETCHING  EXERCISES:  STRETCH - Flexion, Single Knee to Chest   Lie on a firm bed or floor with both legs extended in front of you.  Keeping one leg in contact with the floor, bring your opposite knee to your chest. Hold your leg in place by either grabbing behind your thigh or at your knee.  Pull until you feel a gentle stretch in your low back. Hold 30 seconds.  Slowly release your grasp and repeat the exercise with the opposite side. Repeat 2 times. Complete this exercise 3 times per week.   STRETCH - Flexion, Double Knee to Chest  Lie on a firm bed or floor with both legs extended in front of you.  Keeping one leg in contact with the floor, bring your opposite knee to your chest.  Tense your stomach muscles to support your back and then lift your other knee to your chest. Hold your legs in place by either grabbing behind your thighs or at your knees.  Pull both knees toward your chest until you feel a gentle stretch in your low back. Hold 30 seconds.  Tense your stomach muscles and slowly return one leg at a time to the floor. Repeat 2 times. Complete this exercise 3 times per week.   STRETCH - Low Trunk Rotation  Lie on a firm bed or floor. Keeping your legs in front of you, bend your knees so they are both pointed toward the ceiling and your feet are flat on the floor.  Extend your arms out to  the side. This will stabilize your upper body by keeping your shoulders in contact with the floor.  Gently and slowly drop both knees together to one side until you feel a gentle stretch in your low back. Hold for 30 seconds.  Tense your stomach muscles to support your lower back as you bring your knees back to the starting position. Repeat the exercise to the other side. Repeat 2 times. Complete this exercise at least 3 times per week.   EXTENSION RANGE OF MOTION AND FLEXIBILITY EXERCISES:  STRETCH - Extension, Prone on Elbows   Lie on your stomach on the floor, a bed will be too soft.  Place your palms about shoulder width apart and at the height of your head.  Place your elbows under your shoulders. If this is too painful, stack pillows under your chest.  Allow your body to relax so that your hips drop lower and make contact more completely with the floor.  Hold this position for 30 seconds.  Slowly return to lying flat on the floor. Repeat 2 times. Complete this exercise 3 times per week.   RANGE OF MOTION - Extension, Prone Press Ups  Lie on your stomach on the floor, a bed will be too soft. Place your palms about shoulder width apart and at the height of your head.  Keeping your back as relaxed as possible, slowly straighten your elbows while keeping your hips on the floor. You may adjust the placement of your hands to maximize your comfort. As you gain motion, your hands will come more underneath your shoulders.  Hold this position 30 seconds.  Slowly return to lying flat on the floor. Repeat 2 times. Complete this exercise 3 times per week.   RANGE OF MOTION- Quadruped, Neutral Spine   Assume a hands and knees position on a firm surface. Keep your hands under your shoulders and your knees under your hips. You may place padding under your knees for comfort.  Drop your head and point your tailbone toward the ground below you. This will round out your lower back like an angry cat. Hold this position for 30 seconds.  Slowly lift your head and release your tail bone so that your back sags into a large arch, like an old horse.  Hold this position for 30 seconds.  Repeat this until you feel limber in your low back.  Now, find your "sweet spot." This will be the most comfortable position somewhere between the two previous positions. This is your neutral spine. Once you have found this position, tense your stomach muscles to support your low back.  Hold this position for 30 seconds. Repeat 2 times. Complete this exercise 3 times per week.   STRENGTHENING  EXERCISES - Low Back Sprain These exercises may help you when beginning to rehabilitate your injury. These exercises should be done near your "sweet spot." This is the neutral, low-back arch, somewhere between fully rounded and fully arched, that is your least painful position. When performed in this safe range of motion, these exercises can be used for people who have either a flexion or extension based injury. These exercises may resolve your symptoms with or without further involvement from your physician, physical therapist or athletic trainer. While completing these exercises, remember:   Muscles can gain both the endurance and the strength needed for everyday activities through controlled exercises.  Complete these exercises as instructed by your physician, physical therapist or athletic trainer. Increase the resistance and repetitions only as guided.  You may experience muscle soreness or fatigue, but the pain or discomfort you are trying to eliminate should never worsen during these exercises. If this pain does worsen, stop and make certain you are following the directions exactly. If the pain is still present after adjustments, discontinue the exercise until you can discuss the trouble with your caregiver.  STRENGTHENING - Deep Abdominals, Pelvic Tilt   Lie on a firm bed or floor. Keeping your legs in front of you, bend your knees so they are both pointed toward the ceiling and your feet are flat on the floor.  Tense your lower abdominal muscles to press your low back into the floor. This motion will rotate your pelvis so that your tail bone is scooping upwards rather than pointing at your feet or into the floor. With a gentle tension and even breathing, hold this position for 3 seconds. Repeat 2 times. Complete this exercise 3 times per week.   STRENGTHENING - Abdominals, Crunches   Lie on a firm bed or floor. Keeping your legs in front of you, bend your knees so they are both pointed  toward the ceiling and your feet are flat on the floor. Cross your arms over your chest.  Slightly tip your chin down without bending your neck.  Tense your abdominals and slowly lift your trunk high enough to just clear your shoulder blades. Lifting higher can put excessive stress on the lower back and does not further strengthen your abdominal muscles.  Control your return to the starting position. Repeat 2 times. Complete this exercise 3 times per week.   STRENGTHENING - Quadruped, Opposite UE/LE Lift   Assume a hands and knees position on a firm surface. Keep your hands under your shoulders and your knees under your hips. You may place padding under your knees for comfort.  Find your neutral spine and gently tense your abdominal muscles so that you can maintain this position. Your shoulders and hips should form a rectangle that is parallel with the floor and is not twisted.  Keeping your trunk steady, lift your right hand no higher than your shoulder and then your left leg no higher than your hip. Make sure you are not holding your breath. Hold this position for 30 seconds.  Continuing to keep your abdominal muscles tense and your back steady, slowly return to your starting position. Repeat with the opposite arm and leg. Repeat 2 times. Complete this exercise 3 times per week.   STRENGTHENING - Abdominals and Quadriceps, Straight Leg Raise   Lie on a firm bed or floor with both legs extended in front of you.  Keeping one leg in contact with the floor, bend the other knee so that your foot can rest flat on the floor.  Find your neutral spine, and tense your abdominal muscles to maintain your spinal position throughout the exercise.  Slowly lift your straight leg off the floor about 6 inches for a count of 3, making sure to not hold your breath.  Still keeping your neutral spine, slowly lower your leg all the way to the floor. Repeat this exercise with each leg 2 times. Complete this  exercise 3 times per week.  POSTURE AND BODY MECHANICS CONSIDERATIONS - Low Back Sprain Keeping correct posture when sitting, standing or completing your activities will reduce the stress put on different body tissues, allowing injured tissues a chance to heal and limiting painful experiences. The following are general guidelines for improved posture.  While reading these guidelines, remember:  The exercises prescribed by your provider will help you have the flexibility and strength to maintain correct postures.  The correct posture provides the best environment for your joints to work. All of your joints have less wear and tear when properly supported by a spine with good posture. This means you will experience a healthier, less painful body.  Correct posture must be practiced with all of your activities, especially prolonged sitting and standing. Correct posture is as important when doing repetitive low-stress activities (typing) as it is when doing a single heavy-load activity (lifting).  RESTING POSITIONS Consider which positions are most painful for you when choosing a resting position. If you have pain with flexion-based activities (sitting, bending, stooping, squatting), choose a position that allows you to rest in a less flexed posture. You would want to avoid curling into a fetal position on your side. If your pain worsens with extension-based activities (prolonged standing, working overhead), avoid resting in an extended position such as sleeping on your stomach. Most people will find more comfort when they rest with their spine in a more neutral position, neither too rounded nor too arched. Lying on a non-sagging bed on your side with a pillow between your knees, or on your back with a pillow under your knees will often provide some relief. Keep in mind, being in any one position for a prolonged period of time, no matter how correct your posture, can still lead to stiffness.  PROPER SITTING  POSTURE In order to minimize stress and discomfort on your spine, you must sit with correct posture. Sitting with good posture should be effortless for a healthy body. Returning to good posture is a gradual process. Many people can work toward this most comfortably by using various supports until they have the flexibility and strength to maintain this posture on their own. When sitting with proper posture, your ears will fall over your shoulders and your shoulders will fall over your hips. You should use the back of the chair to support your upper back. Your lower back will be in a neutral position, just slightly arched. You may place a small pillow or folded towel at the base of your lower back for  support.  When working at a desk, create an environment that supports good, upright posture. Without extra support, muscles tire, which leads to excessive strain on joints and other tissues. Keep these recommendations in mind:  CHAIR:  A chair should be able to slide under your desk when your back makes contact with the back of the chair. This allows you to work closely.  The chair's height should allow your eyes to be level with the upper part of your monitor and your hands to be slightly lower than your elbows.  BODY POSITION  Your feet should make contact with the floor. If this is not possible, use a foot rest.  Keep your ears over your shoulders. This will reduce stress on your neck and low back.  INCORRECT SITTING POSTURES  If you are feeling tired and unable to assume a healthy sitting posture, do not slouch or slump. This puts excessive strain on your back tissues, causing more damage and pain. Healthier options include:  Using more support, like a lumbar pillow.  Switching tasks to something that requires you to be upright or walking.  Talking a brief walk.  Lying down to rest in a neutral-spine position.  PROLONGED STANDING WHILE SLIGHTLY LEANING FORWARD  When completing a task  that requires you to  lean forward while standing in one place for a long time, place either foot up on a stationary 2-4 inch high object to help maintain the best posture. When both feet are on the ground, the lower back tends to lose its slight inward curve. If this curve flattens (or becomes too large), then the back and your other joints will experience too much stress, tire more quickly, and can cause pain.  CORRECT STANDING POSTURES Proper standing posture should be assumed with all daily activities, even if they only take a few moments, like when brushing your teeth. As in sitting, your ears should fall over your shoulders and your shoulders should fall over your hips. You should keep a slight tension in your abdominal muscles to brace your spine. Your tailbone should point down to the ground, not behind your body, resulting in an over-extended swayback posture.   INCORRECT STANDING POSTURES  Common incorrect standing postures include a forward head, locked knees and/or an excessive swayback. WALKING Walk with an upright posture. Your ears, shoulders and hips should all line-up.  PROLONGED ACTIVITY IN A FLEXED POSITION When completing a task that requires you to bend forward at your waist or lean over a low surface, try to find a way to stabilize 3 out of 4 of your limbs. You can place a hand or elbow on your thigh or rest a knee on the surface you are reaching across. This will provide you more stability, so that your muscles do not tire as quickly. By keeping your knees relaxed, or slightly bent, you will also reduce stress across your lower back. CORRECT LIFTING TECHNIQUES  DO :  Assume a wide stance. This will provide you more stability and the opportunity to get as close as possible to the object which you are lifting.  Tense your abdominals to brace your spine. Bend at the knees and hips. Keeping your back locked in a neutral-spine position, lift using your leg muscles. Lift with your  legs, keeping your back straight.  Test the weight of unknown objects before attempting to lift them.  Try to keep your elbows locked down at your sides in order get the best strength from your shoulders when carrying an object.     Always ask for help when lifting heavy or awkward objects. INCORRECT LIFTING TECHNIQUES DO NOT:   Lock your knees when lifting, even if it is a small object.  Bend and twist. Pivot at your feet or move your feet when needing to change directions.  Assume that you can safely pick up even a paperclip without proper posture.

## 2019-08-24 NOTE — Progress Notes (Signed)
Chief Complaint  Patient presents with  . Fatigue    wants to do labs today  . Back Pain  . Extremity Weakness    right leg weakness  . Referral    GI    Subjective: Patient is a 74 y.o. female here for f/u.  Several mo of fatigue. Is not sleepy. Diet is fair, does not exercise much. No change in mood, snoring, wt changes. Would like labs checked.   Several weeks of b/l lbp. No inj or change in activity. Worse when she stands for prolonged periods of time.  She is having some right lower extremity weakness over the past 3 weeks.  No numbness or tingling.  No loss of bowel or bladder control.  Hx of Barrett's esophagus. Last upper endoscopy was 3 yrs ago, would like referral.   Past Medical History:  Diagnosis Date  . Barrett's esophagus   . Hyperlipidemia     Objective: BP 120/80 (BP Location: Right Arm, Patient Position: Sitting, Cuff Size: Normal)   Pulse 75   Temp (!) 97.1 F (36.2 C) (Temporal)   Ht 5' 2.5" (1.588 m)   Wt 157 lb 4 oz (71.3 kg)   SpO2 97%   BMI 28.30 kg/m  General: Awake, appears stated age HEENT: MMM, EOMi Heart: RRR, no murmurs, no LE edema, no bruits Neuro: 5/5 strength throughout LE's b/l, DTR's equal and symmetric in LE's.  MSK: Mild ttp over lumbar parasp msc b/l; tight hamstrings b/l, neg straight leg Lungs: CTAB, no rales, wheezes or rhonchi. No accessory muscle use Psych: Age appropriate judgment and insight, normal affect and mood  Assessment and Plan: Fatigue, unspecified type - Plan: CBC, Comprehensive metabolic panel, TSH, VITAMIN D 25 Hydroxy (Vit-D Deficiency, Fractures)  Acute bilateral low back pain without sciatica  Weakness of right lower extremity  Barrett's esophagus with dysplasia - Plan: Ambulatory referral to Gastroenterology  Hyperlipidemia, unspecified hyperlipidemia type - Plan: Lipid panel  Check labs, counseled on diet and exercise. Stretches and exercises for the low back.  Doubt ACS. Neuro exam is unremarkable  today.  Offered physical therapy, she would like to try routine stretches and exercises on her own. Refer to the GI team. Follow-up pending above. The patient voiced understanding and agreement to the plan.  Allen, DO 08/24/19  1:07 PM

## 2019-08-25 ENCOUNTER — Other Ambulatory Visit (INDEPENDENT_AMBULATORY_CARE_PROVIDER_SITE_OTHER): Payer: PPO

## 2019-08-25 ENCOUNTER — Other Ambulatory Visit: Payer: Self-pay | Admitting: Family Medicine

## 2019-08-25 DIAGNOSIS — D509 Iron deficiency anemia, unspecified: Secondary | ICD-10-CM

## 2019-08-25 DIAGNOSIS — E785 Hyperlipidemia, unspecified: Secondary | ICD-10-CM

## 2019-08-25 DIAGNOSIS — E559 Vitamin D deficiency, unspecified: Secondary | ICD-10-CM

## 2019-08-25 LAB — IBC + FERRITIN
Ferritin: 29.8 ng/mL (ref 10.0–291.0)
Iron: 101 ug/dL (ref 42–145)
Saturation Ratios: 27.9 % (ref 20.0–50.0)
Transferrin: 259 mg/dL (ref 212.0–360.0)

## 2019-08-25 MED ORDER — ROSUVASTATIN CALCIUM 20 MG PO TABS: 20.0000 mg | ORAL_TABLET | Freq: Every day | ORAL | 3 refills | Status: DC

## 2019-09-24 ENCOUNTER — Encounter: Payer: Self-pay | Admitting: Gastroenterology

## 2019-10-06 ENCOUNTER — Other Ambulatory Visit: Payer: PPO

## 2019-11-10 ENCOUNTER — Ambulatory Visit: Payer: PPO | Admitting: Gastroenterology

## 2019-11-10 ENCOUNTER — Encounter: Payer: Self-pay | Admitting: Gastroenterology

## 2019-11-10 VITALS — BP 122/82 | HR 73 | Temp 97.8°F | Ht 62.5 in | Wt 154.2 lb

## 2019-11-10 DIAGNOSIS — K219 Gastro-esophageal reflux disease without esophagitis: Secondary | ICD-10-CM | POA: Diagnosis not present

## 2019-11-10 DIAGNOSIS — Z8719 Personal history of other diseases of the digestive system: Secondary | ICD-10-CM | POA: Diagnosis not present

## 2019-11-10 MED ORDER — OMEPRAZOLE 20 MG PO CPDR: 20.0000 mg | DELAYED_RELEASE_CAPSULE | Freq: Two times a day (BID) | ORAL | 3 refills | Status: DC

## 2019-11-10 NOTE — Progress Notes (Signed)
Chief Complaint:   Referring Provider:  Wendling, Nicholas [place];   #1.  GERD with H/O Barrett's. Sis with eso ca at age 74. Last EGD 01/2016 (Dr Shana Chute): small HH, irregular Z-line at 36 cm (GE Jn). Bx- c/w Barrett's.  #2. Neg screening colon 01/2016 (report sent for scanning).  Colonoscopy did show left colonic diverticulosis, hyperplastic colonic polyp.  Patient with mild constipation.  Plan: -Omeprazole 20mg  po qd to continue. -Benefiber 1 TBS po qd in coffee. -EGD for further evaluation. -Walking 30 min/day -Increase water intake. -FU thereafter.   HPI:    Vickie Lopez is a 74 y.o. female  No nausea, vomiting, heartburn, regurgitation, odynophagia or dysphagia.  No significant diarrhea.  No melena or hematochezia. No unintentional weight loss. No abdominal pain.  C/O constipation with pellet-like stools- veg laxative- better. Also better with increased water intake.  Some abdominal bloating and abdominal discomfort which gets better with defecation.  Past GI procedures: (Dr Shana Chute) -Colonoscopy 02/07/2016 moderate left colonic diverticulosis, 3 mm hyperplastic sigmoid polyp, internal hemorrhoids, otherwise normal to TI.  5 cm of TI was intubated.  Good preparation.  Repeat in 10 years. -EGD 02/07/2016 irregular Z-line at 36 cm, small hiatal hernia, mild gastritis.  Biopsies consistent with Barrett's esophagus.  Negative HP.  Past Medical History:  Diagnosis Date  . Barrett's esophagus   . Hyperlipidemia     Past Surgical History:  Procedure Laterality Date  . COLONOSCOPY WITH ESOPHAGOGASTRODUODENOSCOPY (EGD)  02/07/2016   [facility]  . TOTAL HIP ARTHROPLASTY Left 2000    Family History  Problem Relation Age of Onset  . Heart attack Mother   . Heart attack Father   . Cancer Sister        esophageal cancer  . Colon cancer Neg Hx     Social History   Tobacco Use  . Smoking status: Former Research scientist (life sciences)  . Smokeless tobacco: Never  Used  Vaping Use  . Vaping Use: Never used  Substance Use Topics  . Alcohol use: Yes    Comment: ocassionally  . Drug use: Never    Current Outpatient Medications  Medication Sig Dispense Refill  . estradiol (ESTRACE) 2 MG tablet Take 2 mg by mouth daily.    . medroxyPROGESTERone (PROVERA) 2.5 MG tablet Take 2.5 mg by mouth daily.    Marland Kitchen omeprazole (PRILOSEC) 20 MG capsule TAKE 1 CAPSULE (20 MG TOTAL) BY MOUTH 2 (TWO) TIMES DAILY BEFORE A MEAL. 180 capsule 3  . rosuvastatin (CRESTOR) 20 MG tablet Take 1 tablet (20 mg total) by mouth daily. 30 tablet 3  . traZODone (DESYREL) 50 MG tablet Take 50 mg by mouth at bedtime as needed for sleep.    . Vitamin D, Ergocalciferol, (DRISDOL) 1.25 MG (50000 UNIT) CAPS capsule Take 1 capsule (50,000 Units total) by mouth every 7 (seven) days. 12 capsule 0   No current facility-administered medications for this visit.    No Known Allergies  Review of Systems:  Constitutional: Denies fever, chills, diaphoresis, appetite change and fatigue.  HEENT: Denies photophobia, eye pain, redness, hearing loss, ear pain, congestion, sore throat, rhinorrhea, sneezing, mouth sores, neck pain, neck stiffness and tinnitus.   Respiratory: Denies SOB, DOE, cough, chest tightness,  and wheezing.   Cardiovascular: Denies chest pain, palpitations and leg swelling.  Genitourinary: Denies dysuria, urgency, frequency, hematuria, flank pain and difficulty urinating.  Musculoskeletal: Denies myalgias, back pain, joint swelling, arthralgias and gait problem.  Skin: No rash.  Neurological: Denies dizziness, seizures, syncope, weakness, light-headedness, numbness and headaches.  Hematological: Denies adenopathy. Has Easy bruising Psychiatric/Behavioral: Has anxiety or depression     Physical Exam:    BP 122/82   Pulse 73   Temp 97.8 F (36.6 C)   Ht 5' 2.5" (1.588 m)   Wt 154 lb 4 oz (70 kg)   BMI 27.76 kg/m  Wt Readings from Last 3 Encounters:  11/10/19 154 lb 4 oz  (70 kg)  08/24/19 157 lb 4 oz (71.3 kg)  04/02/18 158 lb 6 oz (71.8 kg)   Constitutional:  Well-developed, in no acute distress. Psychiatric: Normal mood and affect. Behavior is normal. HEENT: Pupils normal.  Conjunctivae are normal. No scleral icterus. Neck supple.  Cardiovascular: Normal rate, regular rhythm. No edema Pulmonary/chest: Effort normal and breath sounds normal. No wheezing, rales or rhonchi. Abdominal: Soft, nondistended. Nontender. Bowel sounds active throughout. There are no masses palpable. No hepatomegaly. Rectal:  defered Neurological: Alert and oriented to person place and time. Skin: Skin is warm and dry. No rashes noted.  Data Reviewed: I have personally reviewed following labs and imaging studies  CBC: CBC Latest Ref Rng & Units 08/24/2019  WBC 4.0 - 10.5 K/uL 6.8  Hemoglobin 12.0 - 15.0 g/dL 12.3  Hematocrit 36 - 46 % 35.9(L)  Platelets 150 - 400 K/uL 278.0    CMP: CMP Latest Ref Rng & Units 08/24/2019  Glucose 70 - 99 mg/dL 82  BUN 6 - 23 mg/dL 12  Creatinine 0.40 - 1.20 mg/dL 0.85  Sodium 135 - 145 mEq/L 140  Potassium 3.5 - 5.1 mEq/L 4.3  Chloride 96 - 112 mEq/L 106  CO2 19 - 32 mEq/L 27  Calcium 8.4 - 10.5 mg/dL 9.2  Total Protein 6.0 - 8.3 g/dL 6.4  Total Bilirubin 0.2 - 1.2 mg/dL 0.5  Alkaline Phos 39 - 117 U/L 51  AST 0 - 37 U/L 10  ALT 0 - 35 U/L 7     Carmell Austria, MD 11/10/2019, 10:09 AM  Cc: Shelda Pal*

## 2019-11-10 NOTE — Patient Instructions (Addendum)
If you are age 73 or older, your body mass index should be between 23-30. Your Body mass index is 27.76 kg/m. If this is out of the aforementioned range listed, please consider follow up with your Primary Care Provider.  If you are age 27 or younger, your body mass index should be between 19-25. Your Body mass index is 27.76 kg/m. If this is out of the aformentioned range listed, please consider follow up with your Primary Care Provider.   You have been scheduled for an endoscopy. Please follow written instructions given to you at your visit today. If you use inhalers (even only as needed), please bring them with you on the day of your procedure. Your physician has requested that you go to www.startemmi.com and enter the access code given to you at your visit today. This web site gives a general overview about your procedure. However, you should still follow specific instructions given to you by our office regarding your preparation for the procedure.  We have sent the following medications to your pharmacy for you to pick up at your convenience: Omeprazole  Benefiber 1 tablespoon daily in coffee.  Increase water intake.  Walk for 30 minutes daily.  Thank you for choosing me and [facility].  Jackquline Denmark, MD

## 2019-11-11 ENCOUNTER — Other Ambulatory Visit: Payer: Self-pay | Admitting: Family Medicine

## 2019-11-16 ENCOUNTER — Other Ambulatory Visit: Payer: Self-pay | Admitting: Family Medicine

## 2019-11-23 ENCOUNTER — Encounter: Payer: Self-pay | Admitting: Gastroenterology

## 2019-11-24 ENCOUNTER — Other Ambulatory Visit: Payer: PPO

## 2019-12-06 ENCOUNTER — Ambulatory Visit (AMBULATORY_SURGERY_CENTER): Payer: PPO | Admitting: Gastroenterology

## 2019-12-06 ENCOUNTER — Other Ambulatory Visit: Payer: Self-pay

## 2019-12-06 ENCOUNTER — Encounter: Payer: Self-pay | Admitting: Gastroenterology

## 2019-12-06 VITALS — BP 109/53 | HR 54 | Temp 97.8°F | Resp 12 | Ht 62.0 in | Wt 154.0 lb

## 2019-12-06 DIAGNOSIS — K219 Gastro-esophageal reflux disease without esophagitis: Secondary | ICD-10-CM | POA: Diagnosis not present

## 2019-12-06 DIAGNOSIS — K2270 Barrett's esophagus without dysplasia: Secondary | ICD-10-CM

## 2019-12-06 DIAGNOSIS — Z8719 Personal history of other diseases of the digestive system: Secondary | ICD-10-CM

## 2019-12-06 MED ORDER — SODIUM CHLORIDE 0.9 % IV SOLN: 500.0000 mL | Freq: Once | INTRAVENOUS | Status: DC

## 2019-12-06 MED ORDER — OMEPRAZOLE 20 MG PO CPDR: 20.0000 mg | DELAYED_RELEASE_CAPSULE | Freq: Every day | ORAL | 3 refills | Status: DC

## 2019-12-06 NOTE — Op Note (Signed)
[place] Patient Name: Vickie Lopez Procedure Date: 12/06/2019 9:59 AM MRN: [id number] Endoscopist: Jackquline Denmark , MD Age: 74 Referring MD:  Date of Birth: 1946/05/19 Gender: Female Account #: [id number] Procedure:                Upper GI endoscopy Indications:              Follow-up of Barrett's esophagus, GERD Medicines:                Monitored Anesthesia Care Procedure:                Pre-Anesthesia Assessment:                           - Prior to the procedure, a History and Physical                            was performed, and patient medications and                            allergies were reviewed. The patient's tolerance of                            previous anesthesia was also reviewed. The risks                            and benefits of the procedure and the sedation                            options and risks were discussed with the patient.                            All questions were answered, and informed consent                            was obtained. Prior Anticoagulants: The patient has                            taken no previous anticoagulant or antiplatelet                            agents. ASA Grade Assessment: II - A patient with                            mild systemic disease. After reviewing the risks                            and benefits, the patient was deemed in                            satisfactory condition to undergo the procedure.                           After obtaining informed consent, the endoscope was  passed under direct vision. Throughout the                            procedure, the patient's blood pressure, pulse, and                            oxygen saturations were monitored continuously. The                            Endoscope was introduced through the mouth, and                            advanced to the second part of duodenum. The upper                            GI endoscopy was  accomplished without difficulty.                            The patient tolerated the procedure well. Scope In: Scope Out: Findings:                 There were esophageal mucosal changes suggestive of                            short-segment Barrett's esophagus present in the                            distal esophagus, extending 1 cm above the GE                            junction (from 34 to 35 cm). Mucosa was biopsied                            with a cold forceps for histology in a targeted                            manner, directed by NBI and in 4 quadrants at the                            gastroesophageal junction and just above. 2                            biopsies were taken from all 4 quadrants.                           The entire examined stomach was normal. The                            retroflexed examination of the cardia was normal.                            The GE junction flap was Hill's grade II.  The examined duodenum was normal. Complications:            No immediate complications. Estimated Blood Loss:     Estimated blood loss: none. Impression:               -Esophageal findings s/o short segment Barrett's                            esophagus. Recommendation:           - Patient has a contact number available for                            emergencies. The signs and symptoms of potential                            delayed complications were discussed with the                            patient. Return to normal activities tomorrow.                            Written discharge instructions were provided to the                            patient.                           - Resume previous diet.                           - Continue omeprazole 20 mg p.o. once a day.                           - Can add baby aspirin 1 tablet p.o. once a day.                           - Await pathology results.                           - The findings and  recommendations were discussed                            with the patient's family Vickie Lopez. Jackquline Denmark, MD 12/06/2019 10:27:32 AM This report has been signed electronically.

## 2019-12-06 NOTE — Progress Notes (Signed)
pt tolerated well. VSS. awake and to recovery. Report given to RN. Bite block inserted and removed without trauma. 

## 2019-12-06 NOTE — Progress Notes (Signed)
Called to room to assist during endoscopic procedure.  Patient ID and intended procedure confirmed with present staff. Received instructions for my participation in the procedure from the performing physician.  

## 2019-12-06 NOTE — Progress Notes (Signed)
Pt's states no medical or surgical changes since previsit or office visit.  Vitals/Temp- Vickie Lopez

## 2019-12-06 NOTE — Patient Instructions (Signed)
Impression/Recommendations:  Resume previous diet. Continue Omeprazole 20 mg. By mouth. once daily. Can add baby aspirin (81 mg.) 1 tablet by mouth once daily.  Await pathology results.  YOU HAD AN ENDOSCOPIC PROCEDURE TODAY AT [facility]:   Refer to the procedure report that was given to you for any specific questions about what was found during the examination.  If the procedure report does not answer your questions, please call your gastroenterologist to clarify.  If you requested that your care partner not be given the details of your procedure findings, then the procedure report has been included in a sealed envelope for you to review at your convenience later.  YOU SHOULD EXPECT: Some feelings of bloating in the abdomen. Passage of more gas than usual.  Walking can help get rid of the air that was put into your GI tract during the procedure and reduce the bloating. If you had a lower endoscopy (such as a colonoscopy or flexible sigmoidoscopy) you may notice spotting of blood in your stool or on the toilet paper. If you underwent a bowel prep for your procedure, you may not have a normal bowel movement for a few days.  Please Note:  You might notice some irritation and congestion in your nose or some drainage.  This is from the oxygen used during your procedure.  There is no need for concern and it should clear up in a day or so.  SYMPTOMS TO REPORT IMMEDIATELY:    Following upper endoscopy (EGD)  Vomiting of blood or coffee ground material  New chest pain or pain under the shoulder blades  Painful or persistently difficult swallowing  New shortness of breath  Fever of 100F or higher  Black, tarry-looking stools  For urgent or emergent issues, a gastroenterologist can be reached at any hour by calling [phone]. Do not use MyChart messaging for urgent concerns.    DIET:  We do recommend a small meal at first, but then you may proceed to your regular diet.   Drink plenty of fluids but you should avoid alcoholic beverages for 24 hours.  ACTIVITY:  You should plan to take it easy for the rest of today and you should NOT DRIVE or use heavy machinery until tomorrow (because of the sedation medicines used during the test).    FOLLOW UP: Our staff will call the number listed on your records 48-72 hours following your procedure to check on you and address any questions or concerns that you may have regarding the information given to you following your procedure. If we do not reach you, we will leave a message.  We will attempt to reach you two times.  During this call, we will ask if you have developed any symptoms of COVID 19. If you develop any symptoms (ie: fever, flu-like symptoms, shortness of breath, cough etc.) before then, please call [phone].  If you test positive for Covid 19 in the 2 weeks post procedure, please call and report this information to [place].    If any biopsies were taken you will be contacted by phone or by letter within the next 1-3 weeks.  Please call us at [phone] if you have not heard about the biopsies in 3 weeks.    SIGNATURES/CONFIDENTIALITY: You and/or your care partner have signed paperwork which will be entered into your electronic medical record.  These signatures attest to the fact that that the information above on your After Visit Summary has been reviewed and is  understood.  Full responsibility of the confidentiality of this discharge information lies with you and/or your care-partner.

## 2019-12-08 ENCOUNTER — Telehealth: Payer: Self-pay | Admitting: *Deleted

## 2019-12-08 ENCOUNTER — Telehealth: Payer: Self-pay

## 2019-12-08 NOTE — Telephone Encounter (Signed)
  Follow up Call-  Call back number 12/06/2019  Post procedure Call Back phone  # [phone]  Permission to leave phone message Yes  Some recent data might be hidden     Patient questions:  Message left to call us if necessary.

## 2019-12-08 NOTE — Telephone Encounter (Signed)
°  Follow up Call-  Call back number 12/06/2019  Post procedure Call Back phone  # [phone]  Permission to leave phone message Yes  Some recent data might be hidden     Patient questions:  Do you have a fever, pain , or abdominal swelling? No. Pain Score  0 *  Have you tolerated food without any problems? Yes.    Have you been able to return to your normal activities? Yes.    Do you have any questions about your discharge instructions: Diet   No. Medications  No. Follow up visit  No.  Do you have questions or concerns about your Care? No.  Actions: * If pain score is 4 or above: No action needed, pain <4. 1. Have you developed a fever since your procedure? no  2.   Have you had an respiratory symptoms (SOB or cough) since your procedure? no  3.   Have you tested positive for COVID 19 since your procedure no  4.   Have you had any family members/close contacts diagnosed with the COVID 19 since your procedure?  no   If yes to any of these questions please route to Joylene John, RN and Erenest Rasher, RN

## 2019-12-09 ENCOUNTER — Encounter: Payer: Self-pay | Admitting: Gastroenterology

## 2020-01-07 DIAGNOSIS — H43811 Vitreous degeneration, right eye: Secondary | ICD-10-CM | POA: Diagnosis not present

## 2020-01-10 DIAGNOSIS — Z01419 Encounter for gynecological examination (general) (routine) without abnormal findings: Secondary | ICD-10-CM | POA: Diagnosis not present

## 2020-02-02 DIAGNOSIS — H43811 Vitreous degeneration, right eye: Secondary | ICD-10-CM | POA: Diagnosis not present

## 2020-02-04 NOTE — Addendum Note (Signed)
Addended by: Kelle Darting A on: 02/04/2020 03:28 PM   Modules accepted: Orders

## 2020-02-07 ENCOUNTER — Other Ambulatory Visit (INDEPENDENT_AMBULATORY_CARE_PROVIDER_SITE_OTHER): Payer: PPO

## 2020-02-07 ENCOUNTER — Other Ambulatory Visit: Payer: Self-pay

## 2020-02-07 ENCOUNTER — Telehealth: Payer: Self-pay | Admitting: *Deleted

## 2020-02-07 DIAGNOSIS — R5383 Other fatigue: Secondary | ICD-10-CM | POA: Diagnosis not present

## 2020-02-07 DIAGNOSIS — E559 Vitamin D deficiency, unspecified: Secondary | ICD-10-CM | POA: Diagnosis not present

## 2020-02-07 DIAGNOSIS — E785 Hyperlipidemia, unspecified: Secondary | ICD-10-CM

## 2020-02-07 NOTE — Telephone Encounter (Signed)
Pt came in for lab appt this morning. She states she has been having fatigue since June. She is requesting to have her thyroid checked. I also drew an extra lavender tube if you would like to add a blood count as well. Please place future orders if appropriate.

## 2020-02-07 NOTE — Telephone Encounter (Addendum)
Spoke with [facility] CSR and requested TSH and CBC w/diff be added to today's labs.  Will contact pt tomorrow to schedule f/u with PCP.

## 2020-02-07 NOTE — Telephone Encounter (Signed)
We can add TSH and CBC, though these were normal in April. Plz order. I would like to see her in a couple weeks to discuss fatigue and we can review her labs. Ty.

## 2020-02-08 LAB — TEST AUTHORIZATION

## 2020-02-08 LAB — TSH: TSH: 7.43 mIU/L — ABNORMAL HIGH (ref 0.40–4.50)

## 2020-02-08 LAB — CBC WITH DIFFERENTIAL/PLATELET
Absolute Monocytes: 383 cells/uL (ref 200–950)
Basophils Absolute: 43 cells/uL (ref 0–200)
Basophils Relative: 0.6 %
Eosinophils Absolute: 114 cells/uL (ref 15–500)
Eosinophils Relative: 1.6 %
HCT: 38.5 % (ref 35.0–45.0)
Hemoglobin: 12.8 g/dL (ref 11.7–15.5)
Lymphs Abs: 1818 cells/uL (ref 850–3900)
MCH: 30.8 pg (ref 27.0–33.0)
MCHC: 33.2 g/dL (ref 32.0–36.0)
MCV: 92.8 fL (ref 80.0–100.0)
MPV: 10.8 fL (ref 7.5–12.5)
Monocytes Relative: 5.4 %
Neutro Abs: 4743 cells/uL (ref 1500–7800)
Neutrophils Relative %: 66.8 %
Platelets: 306 10*3/uL (ref 140–400)
RBC: 4.15 10*6/uL (ref 3.80–5.10)
RDW: 13.7 % (ref 11.0–15.0)
Total Lymphocyte: 25.6 %
WBC: 7.1 10*3/uL (ref 3.8–10.8)

## 2020-02-08 LAB — LIPID PANEL
Cholesterol: 180 mg/dL (ref <200)
HDL: 76 mg/dL (ref >=50)
LDL Cholesterol (Calc): 85 mg/dL (calc)
Non-HDL Cholesterol (Calc): 104 mg/dL (calc) (ref <130)
Total CHOL/HDL Ratio: 2.4 (calc) (ref <5.0)
Triglycerides: 99 mg/dL (ref <150)

## 2020-02-08 LAB — EXTRA LAV TOP TUBE

## 2020-02-08 LAB — VITAMIN D 25 HYDROXY (VIT D DEFICIENCY, FRACTURES): Vit D, 25-Hydroxy: 55 ng/mL (ref 30–100)

## 2020-02-10 NOTE — Telephone Encounter (Signed)
Notified pt and she scheduled OV for 02/23/20 at [facility] her PCP will see her first then order additional labs that day.

## 2020-02-20 ENCOUNTER — Other Ambulatory Visit: Payer: Self-pay | Admitting: Family Medicine

## 2020-02-23 ENCOUNTER — Encounter: Payer: Self-pay | Admitting: Family Medicine

## 2020-02-23 ENCOUNTER — Other Ambulatory Visit: Payer: Self-pay

## 2020-02-23 ENCOUNTER — Ambulatory Visit (INDEPENDENT_AMBULATORY_CARE_PROVIDER_SITE_OTHER): Payer: PPO | Admitting: Family Medicine

## 2020-02-23 VITALS — BP 104/64 | HR 67 | Temp 98.1°F | Ht 61.5 in | Wt 151.2 lb

## 2020-02-23 DIAGNOSIS — M25559 Pain in unspecified hip: Secondary | ICD-10-CM | POA: Diagnosis not present

## 2020-02-23 DIAGNOSIS — R5383 Other fatigue: Secondary | ICD-10-CM | POA: Diagnosis not present

## 2020-02-23 DIAGNOSIS — R7989 Other specified abnormal findings of blood chemistry: Secondary | ICD-10-CM | POA: Diagnosis not present

## 2020-02-23 LAB — T4, FREE: Free T4: 1 ng/dL (ref 0.8–1.8)

## 2020-02-23 LAB — TSH: TSH: 4.25 mIU/L (ref 0.40–4.50)

## 2020-02-23 NOTE — Patient Instructions (Addendum)
Give [place] 2-3 business days to get the results of your labs back.   Keep the diet clean and stay active.  Get the tetanus shot and covid booster at the pharmacy.  Let us know if you need anything.  Gluteus Medius Syndrome Rehab It is normal to feel mild stretching, pulling, tightness, or discomfort as you do these exercises, but you should stop right away if you feel sudden pain or your pain gets worse.   Stretching and range of motion exercise This exercise warms up your muscles and joints and improves the movement and flexibility of your hip and pelvis. This exercise also helps to relieve pain and stiffness. Exercise A: Lunge (hip flexor stretch)     1. Kneel on the floor on your left / right knee. Bend your other knee so it is directly over your ankle. 2. Keep good posture with your head over your shoulders. Tuck your tailbone underneath you. This will prevent your back from arching too much. 3. You should feel a gentle stretch in the front of your thigh or hip. If you do not feel a stretch, slowly lunge forward with your chest up. 4. Hold this position for 30 seconds. 5. Slowly return to the starting position. Repeat 2 times. Complete this exercise 3 times per week. Strengthening exercises These exercises build strength and endurance in your hip and pelvis. Endurance is the ability to use your muscles for a long time, even after they get tired. Exercise B: Bridge (hip extensors)    1. Lie on your back on a firm surface with your knees bent and your feet flat on the floor. 2. Tighten your buttocks muscles and lift your bottom off the floor until the trunk of your body is level with your thighs. ? You should feel the muscles working in your buttocks and the back of your thighs. If this exercise is too easy, cross your arms over your chest or lift one leg while your bottom is up off the floor. ? Do not arch your back. 3. Hold this position for 3 seconds. 4. Slowly lower your hips to  the starting position. 5. Let your muscles relax completely between repetitions. Repeat 2 times. Complete this exercise 3 times per week. Exercise C: Straight leg raises (hip abductors)    1. Lie on your side with your left / right leg in the top position. Lie so your head, shoulder, knee, and hip line up. Bend your bottom knee to help you balance. 2. Lift your top leg up 4-6 inches (10-15 cm), keeping your toes pointed straight ahead. 3. Hold this position for 2 seconds. 4. Slowly lower your leg to the starting position and let your muscles relax completely. Repeat for a total of 10 repetitions. Repeat 2 times. Complete this exercise 3 times per week. Exercise D: Hip abductors and external rotators, quadruped 1. Get on your hands and knees on a firm, lightly padded surface. Your hands should be directly below your shoulders, and your knees should be directly below your hips. 2. Lift your left / right knee out to the side. Keep your knee bent. Do not twist your body. 3. Hold this position for 3 seconds. 4. Slowly lower your leg. Repeat for a total of 10 repetitions.  Repeat 2 times. Complete this exercise 3 times per week. Exercise E: Single leg stand 1. Stand near a counter or door frame to hold onto as needed. It is helpful to look in a mirror for this exercise  so you can watch your hip. 2. Squeeze your left / right buttock muscles then lift up your other foot. Do not let your left / righthip push out to the side. 3. Hold this position for 3 seconds. Repeat for a total of 10 repetitions. Repeat 2 times. Complete this exercise 3 times per week. Make sure you discuss any questions you have with your health care provider. Document Released: 05/06/2005 Document Revised: 01/11/2016 Document Reviewed: 04/18/2015 Elsevier Interactive Patient Education  [employer].

## 2020-02-23 NOTE — Progress Notes (Signed)
Chief Complaint  Patient presents with  . Follow-up    thyroid    Subjective: Patient is a 74 y.o. female here for f/u fatigue.  Pt had some labs that showed elevated TSH. Here to f/u on that. Mom had high thyroid medication. Diet is fair. Walking, golfing for exercise. Wakes up feeling well rested, gets about 8 hrs of sleep nightly. Mood is good.   She also mentioned right hip pain over the past 3-4 months.  No injury or change in activity.  It is on the outer side.  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  No bruising, swelling, or numbness/tingling.  Past Medical History:  Diagnosis Date  . Barrett's esophagus   . Hyperlipidemia     Objective: BP 104/64 (BP Location: Left Arm, Patient Position: Sitting, Cuff Size: Normal)   Pulse 67   Temp 98.1 F (36.7 C) (Oral)   Ht 5' 1.5" (1.562 m)   Wt 151 lb 4 oz (68.6 kg)   SpO2 99%   BMI 28.12 kg/m  General: Awake, appears stated age HEENT: MMM, EOMi Heart: RRR, no LE edema Neck: Symmetric, supple, no masses or nodules noted Lungs: CTAB, no rales, wheezes or rhonchi. No accessory muscle use MSK: Mild tenderness to palpation over the distal insertion of the right gluteus medius.  There is no significant tenderness to palpation of the greater trochanter.  Gait is overall normal. Psych: Age appropriate judgment and insight, normal affect and mood  Assessment and Plan: Fatigue, unspecified type - Plan: TSH, T4, free  Elevated TSH - Plan: TSH, T4, free  Hip pain  1/2-check thyroid levels.  If normal, offered sleep study as contingency but she declined.  Counseled on diet and exercise. 3.  Heat, ice, stretches and exercises.  If no improvement, will consider injection versus physical therapy. The patient voiced understanding and agreement to the plan.  [place], DO 02/23/20  12:17 PM

## 2020-03-06 ENCOUNTER — Telehealth: Payer: Self-pay

## 2020-03-06 ENCOUNTER — Ambulatory Visit (INDEPENDENT_AMBULATORY_CARE_PROVIDER_SITE_OTHER): Payer: PPO | Admitting: Family Medicine

## 2020-03-06 ENCOUNTER — Encounter: Payer: Self-pay | Admitting: Family Medicine

## 2020-03-06 ENCOUNTER — Other Ambulatory Visit: Payer: Self-pay

## 2020-03-06 VITALS — BP 118/82 | HR 86 | Temp 98.0°F | Ht 62.0 in | Wt 146.0 lb

## 2020-03-06 DIAGNOSIS — M25551 Pain in right hip: Secondary | ICD-10-CM | POA: Diagnosis not present

## 2020-03-06 DIAGNOSIS — M79651 Pain in right thigh: Secondary | ICD-10-CM | POA: Diagnosis not present

## 2020-03-06 MED ORDER — METHYLPREDNISOLONE ACETATE 80 MG/ML IJ SUSP
80.0000 mg | Freq: Once | INTRAMUSCULAR | Status: AC
  Administered 2020-03-06: 80 mg via INTRAMUSCULAR

## 2020-03-06 MED ORDER — CYCLOBENZAPRINE HCL 5 MG PO TABS: 5.0000 mg | ORAL_TABLET | Freq: Every evening | ORAL | 0 refills | Status: DC | PRN

## 2020-03-06 MED ORDER — TRAMADOL HCL 50 MG PO TABS: 50.0000 mg | ORAL_TABLET | Freq: Three times a day (TID) | ORAL | 0 refills | Status: DC | PRN

## 2020-03-06 MED ORDER — PREDNISONE 20 MG PO TABS: 40.0000 mg | ORAL_TABLET | Freq: Every day | ORAL | 0 refills | Status: AC

## 2020-03-06 NOTE — Patient Instructions (Signed)
If you do not hear anything about your referral in the next 1-2 weeks, call our office and ask for an update.  Send me a message in the next day or two if you are still in great pain.   Heat (pad or rice pillow in microwave) over affected area, 10-15 minutes twice daily.   Ice/cold pack over area for 10-15 min twice daily.  OK to take Tylenol 1000 mg (2 extra strength tabs) or 975 mg (3 regular strength tabs) every 6 hours as needed.  Take Flexeril (cyclobenzaprine) 1-2 hours before planned bedtime. If it makes you drowsy, do not take during the day. You can try half a tab the following night.  Do not drink alcohol, do any illicit/street drugs, drive or do anything that requires alertness while on this medicine.   Let us know if you need anything.

## 2020-03-06 NOTE — Telephone Encounter (Signed)
PA initiated via Covermymeds; KEY: [id number]. PA approved.   PA Case: [id number], Status: Approved, Coverage Starts on: 03/06/2020 12:00:00 AM, Coverage Ends on: 03/06/2021 12:00:00 AM.

## 2020-03-06 NOTE — Progress Notes (Signed)
Musculoskeletal Exam  Patient: Vickie Lopez DOB: [birth year]  DOS: 03/06/2020  SUBJECTIVE:  Chief Complaint:   Chief Complaint  Patient presents with  . Sciatica    Vickie Lopez is a 74 y.o.  female for evaluation and treatment of RLE pain. Her daughter is here with her.   Onset:  2 days ago. No inj or change in activity.  Location: behind R thigh down to ankle Character:  burning and dull  Progression of issue:  has worsened Associated symptoms: weakness in the R leg 2/2 pain; no redness, bruising, swelling, decreased ROM Treatment: to date has been ice, OTC NSAIDS, acetaminophen and heat.   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 Had replaced around 22 years ago by Dr. Senaida Lange.   Past Medical History:  Diagnosis Date  . Barrett's esophagus   . Hyperlipidemia    Objective: VITAL SIGNS: BP 118/82 (BP Location: Left Arm, Patient Position: Sitting, Cuff Size: Normal)   Pulse 86   Temp 98 F (36.7 C) (Oral)   Ht 5\' 2"  (1.575 m)   Wt 146 lb (66.2 kg)   SpO2 96%   BMI 26.70 kg/m  Constitutional: Well formed, well developed. No acute distress. Thorax & Lungs: No accessory muscle use Musculoskeletal: Low back, R thigh.   Normal active range of motion: yes.   Normal passive range of motion: yes Tenderness to palpation: mild ttp over R glute and hamstring Deformity: no Ecchymosis: no Tests positive: none Tests negative: Straight leg, FABER, FADDIR, log roll Neurologic: Normal sensory function. No focal deficits noted. DTR's equal and symmetric in LE's. No clonus. Psychiatric: Normal mood. Age appropriate judgment and insight. Alert & oriented x 3.    Assessment:  Right thigh pain - Plan: Ambulatory referral to Orthopedic Surgery, methylPREDNISolone acetate (DEPO-MEDROL) injection 80 mg  Right hip pain - Plan: Ambulatory referral to Orthopedic Surgery,  methylPREDNISolone acetate (DEPO-MEDROL) injection 80 mg  Plan: Stretches/exercises, heat, ice, Tylenol. Tramadol. Warnings about this verbalized and written down. Will use 1-2 Flexeril at night. Depo injection today, start prednisone tomorrow.  Daughter was upset I would not prescribe oxycodone and that my plan was not sufficient to control her mother's pain. She likened it to what she describes as substandard care her father received after having a stroke. I responded by stating I believe this will help her pain and if they are not pleased with my medical decisions, I am not the only physician in the area. The patient did not voice opposition to the plan throughout.  F/u prn, send message in 1-2 days if no improvement. The patient voiced understanding and agreement to the plan.  Greater than 30 minutes were spent face to face with patient in addition to reviewing her records/chart.   [place], DO 03/06/20  4:32 PM

## 2020-03-07 ENCOUNTER — Other Ambulatory Visit: Payer: Self-pay | Admitting: Family Medicine

## 2020-03-07 MED ORDER — HYDROCODONE-ACETAMINOPHEN 5-325 MG PO TABS
1.0000 | ORAL_TABLET | Freq: Four times a day (QID) | ORAL | 0 refills | Status: DC | PRN
Start: 2020-03-07 — End: 2021-11-02

## 2020-03-09 DIAGNOSIS — M5416 Radiculopathy, lumbar region: Secondary | ICD-10-CM | POA: Diagnosis not present

## 2020-05-11 DIAGNOSIS — L821 Other seborrheic keratosis: Secondary | ICD-10-CM | POA: Diagnosis not present

## 2020-05-11 DIAGNOSIS — L578 Other skin changes due to chronic exposure to nonionizing radiation: Secondary | ICD-10-CM | POA: Diagnosis not present

## 2020-05-11 DIAGNOSIS — Z85828 Personal history of other malignant neoplasm of skin: Secondary | ICD-10-CM | POA: Diagnosis not present

## 2020-05-11 DIAGNOSIS — L570 Actinic keratosis: Secondary | ICD-10-CM | POA: Diagnosis not present

## 2020-05-11 DIAGNOSIS — L905 Scar conditions and fibrosis of skin: Secondary | ICD-10-CM | POA: Diagnosis not present

## 2020-05-11 DIAGNOSIS — D1801 Hemangioma of skin and subcutaneous tissue: Secondary | ICD-10-CM | POA: Diagnosis not present

## 2020-06-10 ENCOUNTER — Other Ambulatory Visit: Payer: Self-pay | Admitting: Family Medicine

## 2020-06-14 ENCOUNTER — Other Ambulatory Visit: Payer: Self-pay | Admitting: Family Medicine

## 2020-10-18 ENCOUNTER — Other Ambulatory Visit: Payer: Self-pay

## 2020-10-18 ENCOUNTER — Ambulatory Visit (INDEPENDENT_AMBULATORY_CARE_PROVIDER_SITE_OTHER): Payer: PPO | Admitting: Family Medicine

## 2020-10-18 ENCOUNTER — Encounter: Payer: Self-pay | Admitting: Family Medicine

## 2020-10-18 VITALS — BP 108/70 | HR 82 | Temp 98.5°F | Ht 63.0 in | Wt 150.5 lb

## 2020-10-18 DIAGNOSIS — R5383 Other fatigue: Secondary | ICD-10-CM | POA: Diagnosis not present

## 2020-10-18 DIAGNOSIS — G8929 Other chronic pain: Secondary | ICD-10-CM | POA: Diagnosis not present

## 2020-10-18 DIAGNOSIS — M545 Low back pain, unspecified: Secondary | ICD-10-CM | POA: Diagnosis not present

## 2020-10-18 LAB — T4, FREE: Free T4: 0.68 ng/dL (ref 0.60–1.60)

## 2020-10-18 LAB — VITAMIN D 25 HYDROXY (VIT D DEFICIENCY, FRACTURES): VITD: 41.77 ng/mL (ref 30.00–100.00)

## 2020-10-18 LAB — TSH: TSH: 4.12 u[IU]/mL (ref 0.35–4.50)

## 2020-10-18 LAB — CBC
HCT: 36.5 % (ref 36.0–46.0)
Hemoglobin: 12.4 g/dL (ref 12.0–15.0)
MCHC: 33.8 g/dL (ref 30.0–36.0)
MCV: 88.4 fl (ref 78.0–100.0)
Platelets: 279 10*3/uL (ref 150.0–400.0)
RBC: 4.13 Mil/uL (ref 3.87–5.11)
RDW: 13.7 % (ref 11.5–15.5)
WBC: 7.9 10*3/uL (ref 4.0–10.5)

## 2020-10-18 LAB — VITAMIN B12: Vitamin B-12: 339 pg/mL (ref 211–911)

## 2020-10-18 NOTE — Progress Notes (Signed)
Musculoskeletal Exam  Patient: Vickie Lopez DOB: 09/26/1945  DOS: 10/18/2020  SUBJECTIVE:  Chief Complaint:   Chief Complaint  Patient presents with  . Back Pain    Vickie Lopez is a 75 y.o.  female for evaluation and treatment of back pain.   Onset:  2 months ago started getting worse.  No inj or change in activity.  Location: lower R Character:  dull  Progression of issue:  has worsened Associated symptoms: walks slower than she used to Denies bowel/bladder incontinence or weakness Treatment: to date has been OTC NSAIDS.   Neurovascular symptoms: no  Fatigue Pt has been dealing w fatigue and feeling overall weak. Mainly when she plays the back 9 in golf. This has been going on for around 2 mo. Diet is fair, she stays active. No depression/anxiety. She snores but does not wake up gasping for air or wake up feeling unrefreshed.   Past Medical History:  Diagnosis Date  . Barrett's esophagus   . Hyperlipidemia     Objective:  VITAL SIGNS: BP 108/70 (BP Location: Left Arm, Patient Position: Sitting, Cuff Size: Normal)   Pulse 82   Temp 98.5 F (36.9 C) (Oral)   Ht 5\' 3"  (1.6 m)   Wt 150 lb 8 oz (68.3 kg)   SpO2 97%   BMI 26.66 kg/m  Constitutional: Well formed, well developed. No acute distress. HENT: Normocephalic, atraumatic.  Heart: RRR Thorax & Lungs:  CTAB. No accessory muscle use Musculoskeletal: low back.   Tenderness to palpation: yes, mild over R TL parasp msc Deformity: no Ecchymosis: no Straight leg test: negative for Poor hamstring flexibility b/l. Neurologic: Normal sensory function. No focal deficits noted. DTR's equal and symmetric in LE's. No clonus. Psychiatric: Normal mood. Age appropriate judgment and insight. Alert & oriented x 3.    Assessment:  Chronic right-sided low back pain without sciatica  Fatigue, unspecified type - Plan: CBC, Comprehensive metabolic panel, VITAMIN D 25 Hydroxy (Vit-D Deficiency, Fractures), B12, TSH, T4,  free  Plan: Stretches/exercises, heat, ice, Tylenol, PT if no better. She will send message 2. New, uncertain prog. Could be related to age. Stay hydrated, counseled on diet/wt bearing exercise. Could consider PT for this as well.  F/u prn. The patient voiced understanding and agreement to the plan.   [place], DO 10/18/20  1:12 PM

## 2020-10-18 NOTE — Patient Instructions (Signed)
Give [place] 2-3 business days to get the results of your labs back.   Try to drink 55-60 oz of water daily outside of exercise.  Ice/cold pack over area for 10-15 min twice daily.  Heat (pad or rice pillow in microwave) over affected area, 10-15 minutes twice daily.    OK to take Tylenol 1000 mg (2 extra strength tabs) or 975 mg (3 regular strength tabs) every 6 hours as needed.   Start doing your stretches/exercises 3 times per week. Send me a message in 3-4 weeks if no better and we will set you up with PT.   Let us know if you need anything.

## 2020-10-20 LAB — COMPREHENSIVE METABOLIC PANEL
ALT: 12 U/L (ref 0–35)
AST: 15 U/L (ref 0–37)
Albumin: 4.5 g/dL (ref 3.5–5.2)
Alkaline Phosphatase: 53 U/L (ref 39–117)
BUN: 20 mg/dL (ref 6–23)
CO2: 20 mEq/L (ref 19–32)
Calcium: 10 mg/dL (ref 8.4–10.5)
Chloride: 107 mEq/L (ref 96–112)
Creatinine, Ser: 0.9 mg/dL (ref 0.40–1.20)
GFR: 63.42 mL/min (ref >60.00)
Glucose, Bld: 88 mg/dL (ref 70–99)
Potassium: 4.3 mEq/L (ref 3.5–5.1)
Sodium: 143 mEq/L (ref 135–145)
Total Bilirubin: 0.3 mg/dL (ref 0.2–1.2)
Total Protein: 6.9 g/dL (ref 6.0–8.3)

## 2020-10-27 DIAGNOSIS — Z1231 Encounter for screening mammogram for malignant neoplasm of breast: Secondary | ICD-10-CM | POA: Diagnosis not present

## 2021-01-18 ENCOUNTER — Other Ambulatory Visit: Payer: Self-pay | Admitting: Gastroenterology

## 2021-01-29 ENCOUNTER — Other Ambulatory Visit: Payer: Self-pay | Admitting: Family Medicine

## 2021-02-07 DIAGNOSIS — D3131 Benign neoplasm of right choroid: Secondary | ICD-10-CM | POA: Diagnosis not present

## 2021-04-09 ENCOUNTER — Ambulatory Visit (INDEPENDENT_AMBULATORY_CARE_PROVIDER_SITE_OTHER): Payer: PPO

## 2021-04-09 VITALS — Ht 63.0 in | Wt 145.0 lb

## 2021-04-09 DIAGNOSIS — Z1159 Encounter for screening for other viral diseases: Secondary | ICD-10-CM | POA: Diagnosis not present

## 2021-04-09 DIAGNOSIS — Z0000 Encounter for general adult medical examination without abnormal findings: Secondary | ICD-10-CM | POA: Diagnosis not present

## 2021-04-09 DIAGNOSIS — Z780 Asymptomatic menopausal state: Secondary | ICD-10-CM

## 2021-04-09 NOTE — Patient Instructions (Signed)
Vickie Lopez , Thank you for taking time to complete your Medicare Wellness Visit. I appreciate your ongoing commitment to your health goals. Please review the following plan we discussed and let me know if I can assist you in the future.   Screening recommendations/referrals: Colonoscopy: Per our conversation, you have received a letter from GI & you will call to schedule. Mammogram: Completed 10/2020-Due 10/2021 Bone Density: Ordered today. Someone will call you to schedule. Recommended yearly ophthalmology/optometry visit for glaucoma screening and checkup Recommended yearly dental visit for hygiene and checkup  Vaccinations: Influenza vaccine:Due-May obtain vaccine at our office or your local pharmacy. Pneumococcal vaccine: Up to date Tdap vaccine: Discuss with pharmacy Shingles vaccine: Completed vaccines-Please obtain date of 2nd dose Covid-19:Booster available at the pharmacy  Advanced directives: Please bring a copy of Living Will and/or Healthcare Power of Attorney for your chart.   Conditions/risks identified: See problem list  Next appointment: Follow up in one year for your annual wellness visit    Preventive Care 65 Years and Older, Female Preventive care refers to lifestyle choices and visits with your health care provider that can promote health and wellness. What does preventive care include? A yearly physical exam. This is also called an annual well check. Dental exams once or twice a year. Routine eye exams. Ask your health care provider how often you should have your eyes checked. Personal lifestyle choices, including: Daily care of your teeth and gums. Regular physical activity. Eating a healthy diet. Avoiding tobacco and drug use. Limiting alcohol use. Practicing safe sex. Taking low-dose aspirin every day. Taking vitamin and mineral supplements as recommended by your health care provider. What happens during an annual well check? The services and screenings  done by your health care provider during your annual well check will depend on your age, overall health, lifestyle risk factors, and family history of disease. Counseling  Your health care provider may ask you questions about your: Alcohol use. Tobacco use. Drug use. Emotional well-being. Home and relationship well-being. Sexual activity. Eating habits. History of falls. Memory and ability to understand (cognition). Work and work Statistician. Reproductive health. Screening  You may have the following tests or measurements: Height, weight, and BMI. Blood pressure. Lipid and cholesterol levels. These may be checked every 5 years, or more frequently if you are over 2 years old. Skin check. Lung cancer screening. You may have this screening every year starting at age 56 if you have a 30-pack-year history of smoking and currently smoke or have quit within the past 15 years. Fecal occult blood test (FOBT) of the stool. You may have this test every year starting at age 33. Flexible sigmoidoscopy or colonoscopy. You may have a sigmoidoscopy every 5 years or a colonoscopy every 10 years starting at age 27. Hepatitis C blood test. Hepatitis B blood test. Sexually transmitted disease (STD) testing. Diabetes screening. This is done by checking your blood sugar (glucose) after you have not eaten for a while (fasting). You may have this done every 1-3 years. Bone density scan. This is done to screen for osteoporosis. You may have this done starting at age 75. Mammogram. This may be done every 1-2 years. Talk to your health care provider about how often you should have regular mammograms. Talk with your health care provider about your test results, treatment options, and if necessary, the need for more tests. Vaccines  Your health care provider may recommend certain vaccines, such as: Influenza vaccine. This is recommended every year. Tetanus,  diphtheria, and acellular pertussis (Tdap, Td)  vaccine. You may need a Td booster every 10 years. Zoster vaccine. You may need this after age 63. Pneumococcal 13-valent conjugate (PCV13) vaccine. One dose is recommended after age 72. Pneumococcal polysaccharide (PPSV23) vaccine. One dose is recommended after age 42. Talk to your health care provider about which screenings and vaccines you need and how often you need them. This information is not intended to replace advice given to you by your health care provider. Make sure you discuss any questions you have with your health care provider. Document Released: 06/02/2015 Document Revised: 01/24/2016 Document Reviewed: 03/07/2015 Elsevier Interactive Patient Education  2017 [place] Prevention in the Home Falls can cause injuries. They can happen to people of all ages. There are many things you can do to make your home safe and to help prevent falls. What can I do on the outside of my home? Regularly fix the edges of walkways and driveways and fix any cracks. Remove anything that might make you trip as you walk through a door, such as a raised step or threshold. Trim any bushes or trees on the path to your home. Use bright outdoor lighting. Clear any walking paths of anything that might make someone trip, such as rocks or tools. Regularly check to see if handrails are loose or broken. Make sure that both sides of any steps have handrails. Any raised decks and porches should have guardrails on the edges. Have any leaves, snow, or ice cleared regularly. Use sand or salt on walking paths during winter. Clean up any spills in your garage right away. This includes oil or grease spills. What can I do in the bathroom? Use night lights. Install grab bars by the toilet and in the tub and shower. Do not use towel bars as grab bars. Use non-skid mats or decals in the tub or shower. If you need to sit down in the shower, use a plastic, non-slip stool. Keep the floor dry. Clean up any  water that spills on the floor as soon as it happens. Remove soap buildup in the tub or shower regularly. Attach bath mats securely with double-sided non-slip rug tape. Do not have throw rugs and other things on the floor that can make you trip. What can I do in the bedroom? Use night lights. Make sure that you have a light by your bed that is easy to reach. Do not use any sheets or blankets that are too big for your bed. They should not hang down onto the floor. Have a firm chair that has side arms. You can use this for support while you get dressed. Do not have throw rugs and other things on the floor that can make you trip. What can I do in the kitchen? Clean up any spills right away. Avoid walking on wet floors. Keep items that you use a lot in easy-to-reach places. If you need to reach something above you, use a strong step stool that has a grab bar. Keep electrical cords out of the way. Do not use floor polish or wax that makes floors slippery. If you must use wax, use non-skid floor wax. Do not have throw rugs and other things on the floor that can make you trip. What can I do with my stairs? Do not leave any items on the stairs. Make sure that there are handrails on both sides of the stairs and use them. Fix handrails that are broken or loose. Make  sure that handrails are as long as the stairways. Check any carpeting to make sure that it is firmly attached to the stairs. Fix any carpet that is loose or worn. Avoid having throw rugs at the top or bottom of the stairs. If you do have throw rugs, attach them to the floor with carpet tape. Make sure that you have a light switch at the top of the stairs and the bottom of the stairs. If you do not have them, ask someone to add them for you. What else can I do to help prevent falls? Wear shoes that: Do not have high heels. Have rubber bottoms. Are comfortable and fit you well. Are closed at the toe. Do not wear sandals. If you use a  stepladder: Make sure that it is fully opened. Do not climb a closed stepladder. Make sure that both sides of the stepladder are locked into place. Ask someone to hold it for you, if possible. Clearly mark and make sure that you can see: Any grab bars or handrails. First and last steps. Where the edge of each step is. Use tools that help you move around (mobility aids) if they are needed. These include: Canes. Walkers. Scooters. Crutches. Turn on the lights when you go into a dark area. Replace any light bulbs as soon as they burn out. Set up your furniture so you have a clear path. Avoid moving your furniture around. If any of your floors are uneven, fix them. If there are any pets around you, be aware of where they are. Review your medicines with your doctor. Some medicines can make you feel dizzy. This can increase your chance of falling. Ask your doctor what other things that you can do to help prevent falls. This information is not intended to replace advice given to you by your health care provider. Make sure you discuss any questions you have with your health care provider. Document Released: 03/02/2009 Document Revised: 10/12/2015 Document Reviewed: 06/10/2014 Elsevier Interactive Patient Education  2017 [employer].

## 2021-04-09 NOTE — Progress Notes (Signed)
Subjective:   Vickie Lopez is a 75 y.o. female who presents for Medicare Annual (Subsequent) preventive examination.  I connected with Vickie Lopez today by telephone and verified that I am speaking with the correct person using two identifiers. Location patient: home Location provider: work Persons participating in the virtual visit: patient, Vickie Lopez.    I discussed the limitations, risks, security and privacy concerns of performing an evaluation and management service by telephone and the availability of in person appointments. I also discussed with the patient that there may be a patient responsible charge related to this service. The patient expressed understanding and verbally consented to this telephonic visit.    Interactive audio and video telecommunications were attempted between this provider and patient, however failed, due to patient having technical difficulties OR patient did not have access to video capability.  We continued and completed visit with audio only.  Some vital signs may be absent or patient reported.   Time Spent with patient on telephone encounter: 25 minutes   Review of Systems     Cardiac Risk Factors include: advanced age (>75men, >78 women);dyslipidemia     Objective:    Today's Vitals   04/09/21 1141  Weight: 145 lb (65.8 kg)  Height: 5\' 3"  (1.6 m)   Body mass index is 25.69 kg/m.  Advanced Directives 04/09/2021  Does Patient Have a Medical Advance Directive? Yes  Type of Paramedic of [place];Living will  Copy of [place] in Chart? No - copy requested    Current Medications (verified) Outpatient Encounter Medications as of 04/09/2021  Medication Sig   cyclobenzaprine (FLEXERIL) 5 MG tablet Take 1-2 tablets (5-10 mg total) by mouth at bedtime as needed for muscle spasms.   estradiol (ESTRACE) 2 MG tablet Take 2 mg by mouth daily.   medroxyPROGESTERone (PROVERA) 2.5 MG tablet Take 2.5 mg by mouth  daily.   omeprazole (PRILOSEC) 20 MG capsule TAKE 1 CAPSULE (20 MG TOTAL) BY MOUTH 2 (TWO) TIMES DAILY BEFORE A MEAL.   rosuvastatin (CRESTOR) 20 MG tablet TAKE 1 TABLET BY MOUTH EVERY DAY   HYDROcodone-acetaminophen (NORCO/VICODIN) 5-325 MG tablet Take 1 tablet by mouth every 6 (six) hours as needed for moderate pain. (Patient not taking: Reported on 04/09/2021)   No facility-administered encounter medications on file as of 04/09/2021.    Allergies (verified) Patient has no known allergies.   History: Past Medical History:  Diagnosis Date   Barrett's esophagus    Hyperlipidemia    Past Surgical History:  Procedure Laterality Date   COLONOSCOPY WITH ESOPHAGOGASTRODUODENOSCOPY (EGD)  02/07/2016   [facility]   TOTAL HIP ARTHROPLASTY Left 2000   Family History  Problem Relation Age of Onset   Heart attack Mother    Heart attack Father    Cancer Sister        esophageal cancer   Colon cancer Neg Hx    Social History   Socioeconomic History   Marital status: Single    Spouse name: Not on file   Number of children: Not on file   Years of education: Not on file   Highest education level: Not on file  Occupational History   Not on file  Tobacco Use   Smoking status: Former   Smokeless tobacco: Never  Vaping Use   Vaping Use: Never used  Substance and Sexual Activity   Alcohol use: Yes    Comment: ocassionally   Drug use: Never   Sexual activity: Not Currently  Other Topics Concern   Not on file  Social History Narrative   Not on file   Social Determinants of Health   Financial Resource Strain: Low Risk    Difficulty of Paying Living Expenses: Not hard at all  Food Insecurity: No Food Insecurity   Worried About Charity fundraiser in the Last Year: Never true   [place] in the Last Year: Never true  Transportation Needs: No Transportation Needs   Lack of Transportation (Medical): No   Lack of Transportation (Non-Medical): No  Physical Activity:  Insufficiently Active   Days of Exercise per Week: 4 days   Minutes of Exercise per Session: 30 min  Stress: No Stress Concern Present   Feeling of Stress : Not at all  Social Connections: Moderately Integrated   Frequency of Communication with Friends and Family: More than three times a week   Frequency of Social Gatherings with Friends and Family: More than three times a week   Attends Religious Services: More than 4 times per year   Active Member of [employer] or Organizations: Yes   Attends Music therapist: More than 4 times per year   Marital Status: Divorced    Tobacco Counseling Counseling given: Not Answered   Clinical Intake:  Pre-visit preparation completed: Yes  Pain : No/denies pain     BMI - recorded: 25.69 Nutritional Status: BMI 25 -29 Overweight Nutritional Risks: None Diabetes: No  How often do you need to have someone help you when you read instructions, pamphlets, or other written materials from your doctor or pharmacy?: 1 - Never  Diabetic?No  Interpreter Needed?: No  Information entered by :: Vickie Hamman LPN   Activities of Daily Living In your present state of health, do you have any difficulty performing the following activities: 04/09/2021  Hearing? Y  Comment hearing aids  Vision? N  Difficulty concentrating or making decisions? N  Walking or climbing stairs? N  Dressing or bathing? N  Doing errands, shopping? N  Preparing Food and eating ? N  Using the Toilet? N  In the past six months, have you accidently leaked urine? N  Do you have problems with loss of bowel control? N  Managing your Medications? N  Managing your Finances? N  Housekeeping or managing your Housekeeping? N  Some recent data might be hidden    Patient Care Team: Shelda Pal, DO as PCP - General (Family Medicine)  Indicate any recent Medical Services you may have received from other than [facility] providers in the past year (date may be  approximate).     Assessment:   This is a routine wellness examination for Vickie Lopez.  Hearing/Vision screen Hearing Screening - Comments:: Bilateral hearing aids Vision Screening - Comments:: Last eye exam-2 months ago-[facility]  Dietary issues and exercise activities discussed: Current Exercise Habits: Home exercise routine, Type of exercise: walking, Time (Minutes): 20, Frequency (Times/Week): 4, Weekly Exercise (Minutes/Week): 80, Intensity: Mild   Goals Addressed             This Visit's Progress    Patient Stated       Maintain current health       Depression Screen PHQ 2/9 Scores 04/09/2021 10/18/2020 02/23/2020  PHQ - 2 Score 0 0 0    Fall Risk Fall Risk  04/09/2021 10/18/2020 12/17/2018  Falls in the past year? 0 0 (No Data)  Comment - - Emmi Telephone Survey: data to providers prior to load  Number falls in past yr: 0 0 (No Data)  Comment - - Emmi Telephone Survey Actual Response =   Injury with Fall? 0 0 -  Risk for fall due to : - No Fall Risks -  Follow up Falls prevention discussed Falls evaluation completed -    FALL RISK PREVENTION PERTAINING TO THE HOME:  Any stairs in or around the home? Yes  If so, are there any without handrails? No  Home free of loose throw rugs in walkways, pet beds, electrical cords, etc? Yes  Adequate lighting in your home to reduce risk of falls? Yes   ASSISTIVE DEVICES UTILIZED TO PREVENT FALLS:  Life alert? No  Use of a cane, walker or w/c? No  Grab bars in the bathroom? No  Shower chair or bench in shower? Yes  Elevated toilet seat or a handicapped toilet? No   TIMED UP AND GO:  Was the test performed? No . Phone visit   Cognitive Function:Normal cognitive status assessed by this Nurse Health Advisor. No abnormalities found.          Immunizations Immunization History  Administered Date(s) Administered   Influenza, High Dose Seasonal PF 03/06/2018, 02/16/2019   PFIZER(Purple Top)SARS-COV-2  Vaccination 07/13/2019, 08/03/2019, 04/11/2020   Pneumococcal Conjugate-13 05/27/2014, 10/27/2015   Pneumococcal Polysaccharide-23 05/03/2016   Zoster Recombinat (Shingrix) 06/02/2019    TDAP status: Due, Education has been provided regarding the importance of this vaccine. Advised may receive this vaccine at local pharmacy or Health Dept. Aware to provide a copy of the vaccination record if obtained from local pharmacy or Health Dept. Verbalized acceptance and understanding.  Flu Vaccine status: Due, Education has been provided regarding the importance of this vaccine. Advised may receive this vaccine at local pharmacy or Health Dept. Aware to provide a copy of the vaccination record if obtained from local pharmacy or Health Dept. Verbalized acceptance and understanding.  Pneumococcal vaccine status: Up to date  Covid-19 vaccine status: Information provided on how to obtain vaccines.   Qualifies for Shingles Vaccine? No   Zostavax completed No   Shingrix Completed?: Yes  Screening Tests Health Maintenance  Topic Date Due   Hepatitis C Screening  Never done   MAMMOGRAM  Never done   DEXA SCAN  Never done   TETANUS/TDAP  10/30/2013   Zoster Vaccines- Shingrix (2 of 2) 07/28/2019   COVID-19 Vaccine (4 - Booster for Pfizer series) 06/06/2020   INFLUENZA VACCINE  12/18/2020   COLONOSCOPY (Pts 45-19yrs Insurance coverage will need to be confirmed)  02/06/2026   Pneumonia Vaccine 36+ Years old  Completed   HPV VACCINES  Aged Out    Health Maintenance  Health Maintenance Due  Topic Date Due   Hepatitis C Screening  Never done   MAMMOGRAM  Never done   DEXA SCAN  Never done   TETANUS/TDAP  10/30/2013   Zoster Vaccines- Shingrix (2 of 2) 07/28/2019   COVID-19 Vaccine (4 - Booster for Pfizer series) 06/06/2020   INFLUENZA VACCINE  12/18/2020    Colorectal cancer screening: Type of screening: Colonoscopy. Completed 02/07/2016. Repeat every 10 years  Mammogram status: Completed  bilateral 10/2020. Repeat every year  Bone Density status: Ordered today. Pt provided with contact info and advised to call to schedule appt.  Lung Cancer Screening: (Low Dose CT Chest recommended if Age 26-80 years, 30 pack-year currently smoking OR have quit w/in 15years.) does not qualify.     Additional Screening:  Hepatitis C Screening: does qualify; Ordered today-patient will  make an appt to come in for lab.  Vision Screening: Recommended annual ophthalmology exams for early detection of glaucoma and other disorders of the eye. Is the patient up to date with their annual eye exam?  Yes  Who is the provider or what is the name of the office in which the patient attends annual eye exams? [facility]   Dental Screening: Recommended annual dental exams for proper oral hygiene  Community Resource Referral / Chronic Care Management: CRR required this visit?  No   CCM required this visit?  No      Plan:     I have personally reviewed and noted the following in the patient's chart:   Medical and social history Use of alcohol, tobacco or illicit drugs  Current medications and supplements including opioid prescriptions.  Functional ability and status Nutritional status Physical activity Advanced directives List of other physicians Hospitalizations, surgeries, and ER visits in previous 12 months Vitals Screenings to include cognitive, depression, and falls Referrals and appointments  In addition, I have reviewed and discussed with patient certain preventive protocols, quality metrics, and best practice recommendations. A written personalized care plan for preventive services as well as general preventive health recommendations were provided to patient.   Due to this being a telephonic visit, the after visit summary with patients personalized plan was offered to patient via mail or my-chart. Patient would like to access on my-chart.   Marta Antu,  LPN   [id number]  Nurse Health Advisor  Nurse Notes: None

## 2021-04-19 ENCOUNTER — Ambulatory Visit (HOSPITAL_BASED_OUTPATIENT_CLINIC_OR_DEPARTMENT_OTHER)
Admission: RE | Admit: 2021-04-19 | Discharge: 2021-04-19 | Disposition: A | Discharge status: Home / Self Care | Payer: PPO | Source: Ambulatory Visit | Attending: Family Medicine | Admitting: Family Medicine

## 2021-04-19 ENCOUNTER — Other Ambulatory Visit (INDEPENDENT_AMBULATORY_CARE_PROVIDER_SITE_OTHER): Payer: PPO

## 2021-04-19 ENCOUNTER — Other Ambulatory Visit: Payer: Self-pay

## 2021-04-19 DIAGNOSIS — Z780 Asymptomatic menopausal state: Secondary | ICD-10-CM | POA: Diagnosis not present

## 2021-04-19 DIAGNOSIS — Z1159 Encounter for screening for other viral diseases: Secondary | ICD-10-CM

## 2021-04-19 NOTE — Addendum Note (Signed)
Addended by: Kelle Darting A on: 04/19/2021 01:45 PM   Modules accepted: Orders

## 2021-04-20 LAB — HEPATITIS C ANTIBODY
Hepatitis C Ab: NONREACTIVE
SIGNAL TO CUT-OFF: <0.02 (ref <1.00)

## 2021-04-26 ENCOUNTER — Other Ambulatory Visit: Payer: Self-pay | Admitting: Gastroenterology

## 2021-05-01 DIAGNOSIS — L570 Actinic keratosis: Secondary | ICD-10-CM | POA: Diagnosis not present

## 2021-05-01 DIAGNOSIS — Z85828 Personal history of other malignant neoplasm of skin: Secondary | ICD-10-CM | POA: Diagnosis not present

## 2021-05-01 DIAGNOSIS — D1801 Hemangioma of skin and subcutaneous tissue: Secondary | ICD-10-CM | POA: Diagnosis not present

## 2021-05-01 DIAGNOSIS — L578 Other skin changes due to chronic exposure to nonionizing radiation: Secondary | ICD-10-CM | POA: Diagnosis not present

## 2021-05-01 DIAGNOSIS — D235 Other benign neoplasm of skin of trunk: Secondary | ICD-10-CM | POA: Diagnosis not present

## 2021-06-07 DIAGNOSIS — Z79899 Other long term (current) drug therapy: Secondary | ICD-10-CM | POA: Diagnosis not present

## 2021-06-07 DIAGNOSIS — K2270 Barrett's esophagus without dysplasia: Secondary | ICD-10-CM | POA: Diagnosis not present

## 2021-06-07 DIAGNOSIS — Z1211 Encounter for screening for malignant neoplasm of colon: Secondary | ICD-10-CM | POA: Diagnosis not present

## 2021-06-07 DIAGNOSIS — K219 Gastro-esophageal reflux disease without esophagitis: Secondary | ICD-10-CM | POA: Diagnosis not present

## 2021-06-20 ENCOUNTER — Other Ambulatory Visit: Payer: Self-pay | Admitting: Family Medicine

## 2021-07-21 ENCOUNTER — Other Ambulatory Visit: Payer: Self-pay | Admitting: Gastroenterology

## 2021-10-28 ENCOUNTER — Other Ambulatory Visit: Payer: Self-pay | Admitting: Gastroenterology

## 2021-10-30 DIAGNOSIS — Z1231 Encounter for screening mammogram for malignant neoplasm of breast: Secondary | ICD-10-CM | POA: Diagnosis not present

## 2021-10-30 LAB — HM MAMMOGRAPHY

## 2021-10-31 ENCOUNTER — Encounter: Payer: Self-pay | Admitting: Family Medicine

## 2021-11-02 ENCOUNTER — Encounter: Payer: Self-pay | Admitting: Family Medicine

## 2021-11-02 ENCOUNTER — Ambulatory Visit (INDEPENDENT_AMBULATORY_CARE_PROVIDER_SITE_OTHER): Payer: PPO | Admitting: Family Medicine

## 2021-11-02 VITALS — BP 130/72 | HR 69 | Resp 18 | Ht 63.0 in | Wt 168.0 lb

## 2021-11-02 DIAGNOSIS — G8929 Other chronic pain: Secondary | ICD-10-CM

## 2021-11-02 DIAGNOSIS — M545 Low back pain, unspecified: Secondary | ICD-10-CM | POA: Diagnosis not present

## 2021-11-02 MED ORDER — TIZANIDINE HCL 4 MG PO TABS: 4.0000 mg | ORAL_TABLET | Freq: Four times a day (QID) | ORAL | 1 refills | Status: DC | PRN

## 2021-11-02 MED ORDER — DICLOFENAC SODIUM 1 % EX GEL: 2.0000 g | Freq: Four times a day (QID) | CUTANEOUS | 2 refills | Status: DC

## 2021-11-02 NOTE — Patient Instructions (Signed)
Heat (pad or rice pillow in microwave) over affected area, 10-15 minutes twice daily.   Ice/cold pack over area for 10-15 min twice daily.  OK to take Tylenol 1000 mg (2 extra strength tabs) or 975 mg (3 regular strength tabs) every 6 hours as needed.  Voltaren gel is available over the counter. Compare prices with the prescription. Use it up to 4 times daily as needd.  If you do not hear anything about your referral in the next 1-2 weeks, call our office and ask for an update.  Continue your home stretches.  Let us know if you need anything.

## 2021-11-02 NOTE — Progress Notes (Signed)
Musculoskeletal Exam  Patient: Vickie Lopez DOB: August 02, 1945  DOS: 11/02/2021  SUBJECTIVE:  Chief Complaint:   Chief Complaint  Patient presents with   Follow-up    Back improvement     Vickie Lopez is a 76 y.o.  female for evaluation and treatment of back pain.   Onset:  chronic Location: lower, bilateral Character:  aching  Progression of issue:  is unchanged Associated symptoms: tingling in RLE Denies bowel/bladder incontinence or weakness Treatment: to date has been OTC NSAIDS, acetaminophen, muscle relaxers, and home exercises.   Neurovascular symptoms: no  Past Medical History:  Diagnosis Date   Barrett's esophagus    Hyperlipidemia     Objective:  VITAL SIGNS: BP 130/72   Pulse 69   Resp 18   Ht '5\' 3"'$  (1.6 m)   Wt 168 lb (76.2 kg)   SpO2 99%   BMI 29.76 kg/m  Constitutional: Well formed, well developed. No acute distress. HENT: Normocephalic, atraumatic.  Thorax & Lungs:  No accessory muscle use Musculoskeletal: low back.   Tenderness to palpation: mild over the lumbar parasp msc b/l Deformity: no Ecchymosis: no Straight leg test: negative for Poor hamstring flexibility b/l. Neurologic: Normal sensory function. No focal deficits noted. DTR's equal and symmetric in LE's. No clonus. Psychiatric: Normal mood. Age appropriate judgment and insight. Alert & oriented x 3.    Assessment:  Chronic bilateral low back pain without sciatica - Plan: Ambulatory referral to Physical Therapy, tiZANidine (ZANAFLEX) 4 MG tablet, diclofenac Sodium (VOLTAREN) 1 % GEL  Plan: Chronic, uncontrolled. Stop daily ibuprofen. Change to topical NSAID prn and muscle relaxer prn. Refer PT. Cont stretches/exercises, heat, ice, Tylenol. Would consider PM&R and tramadol vs Cymbalta if no better in 2 mo when she f/u's.  The patient voiced understanding and agreement to the plan.   [place], DO 11/02/21  9:47 AM

## 2021-11-28 ENCOUNTER — Ambulatory Visit: Payer: PPO | Attending: Family Medicine | Admitting: Physical Therapy

## 2021-11-28 ENCOUNTER — Encounter: Payer: Self-pay | Admitting: Physical Therapy

## 2021-11-28 DIAGNOSIS — G8929 Other chronic pain: Secondary | ICD-10-CM | POA: Insufficient documentation

## 2021-11-28 DIAGNOSIS — R293 Abnormal posture: Secondary | ICD-10-CM | POA: Insufficient documentation

## 2021-11-28 DIAGNOSIS — M545 Low back pain, unspecified: Secondary | ICD-10-CM | POA: Insufficient documentation

## 2021-11-28 DIAGNOSIS — M5459 Other low back pain: Secondary | ICD-10-CM | POA: Diagnosis not present

## 2021-11-28 DIAGNOSIS — M5441 Lumbago with sciatica, right side: Secondary | ICD-10-CM | POA: Diagnosis not present

## 2021-11-28 NOTE — Therapy (Signed)
OUTPATIENT PHYSICAL THERAPY THORACOLUMBAR EVALUATION   Patient Name: Vickie Lopez MRN: [id number] DOB:[birth year], 76 y.o., female Today's Date: 11/28/2021   PT End of Session - 11/28/21 1451     Visit Number 1    Number of Visits 1    Authorization Type [employer]    PT Start Time 3143    PT Stop Time 1533    PT Time Calculation (min) 46 min    Activity Tolerance Patient tolerated treatment well    Behavior During Therapy [facility] for tasks assessed/performed             Past Medical History:  Diagnosis Date   Barrett's esophagus    Hyperlipidemia    Past Surgical History:  Procedure Laterality Date   COLONOSCOPY WITH ESOPHAGOGASTRODUODENOSCOPY (EGD)  02/07/2016   [facility]   TOTAL HIP ARTHROPLASTY Left 2000   Patient Active Problem List   Diagnosis Date Noted   Insomnia 03/06/2018   Hyperlipidemia 03/06/2018   Barrett's esophagus with dysplasia     PCP: Shelda Pal, DO  REFERRING PROVIDER: Shelda Pal, DO  REFERRING DIAG: [phone] (ICD-10-CM) - Chronic bilateral low back pain without sciatica  Rationale for Evaluation and Treatment Rehabilitation  THERAPY DIAG:  Other low back pain  Abnormal posture  Chronic midline low back pain with right-sided sciatica  ONSET DATE: ~ 1 year  SUBJECTIVE:                                                                                                                                                                                           SUBJECTIVE STATEMENT: Patient reports she is very active, plays 18 goals of golf 2x/week and bowls 1x/week.  She usually has some pain but this week had no pain, able to get through full round of golf (usually pain starts at 14th hole).  Not sure if she needs PT.   PERTINENT HISTORY:  Barrett's esophagus, chronic LBP, L THA  PAIN:  Are you having pain? Yes: NPRS scale: 0/10 Pain location: low back; at worst 3/10 with pain radiating from R buttock  to ankle  Pain description: twinges, aches, sore Aggravating factors: walking long distances Relieving factors: sitting down   PRECAUTIONS: None  WEIGHT BEARING RESTRICTIONS No  FALLS:  Has patient fallen in last 6 months? No  LIVING ENVIRONMENT: Lives with: lives alone Lives in: House/apartment Stairs:  2 steps garage to kitchen Has following equipment at home: None  OCCUPATION: Cabin crew, works part-time  PLOF: Independent  PATIENT GOALS like for occasional leg pain to completely go away   OBJECTIVE:   DIAGNOSTIC FINDINGS:  No recent  imaging available.    PATIENT SURVEYS:  Modified Oswestry 7/50 = 14% minimal disability   SCREENING FOR RED FLAGS: Bowel or bladder incontinence: No Spinal tumors: No Cauda equina syndrome: No Compression fracture: No Abdominal aneurysm: No  COGNITION:  Overall cognitive status: Within functional limits for tasks assessed     SENSATION: Light touch: WFL Reports RLE "feels different" than LLE but unable to describe, intermittent tingling  MUSCLE LENGTH: Hamstrings: Right 45 deg; Left 75 deg Ely's test: Right 80 deg; Left 90 deg  POSTURE: rounded shoulders, forward head, decreased lumbar lordosis, and increased thoracic kyphosis  PALPATION: Tenderness T12-L2 with PA mobs.  Tightness R piriformis.   LUMBAR ROM:   Active  AROM  eval  Flexion Mid shin  Extension Limited 50%  Right lateral flexion To knee  Left lateral flexion To knee  Right rotation WNL  Left rotation WNL   (Blank rows = not tested)  LOWER EXTREMITY MMT:     MMT  Right* eval Left eval  Hip flexion 4+ 4+  Hip extension 4+ 4+  Hip abduction 5 5  Hip adduction 5 5  Knee flexion 5 5  Knee extension 5 5  Ankle dorsiflexion 5 5  Ankle plantarflexion     (Blank rows = not tested)  LOWER EXTREMITY ROM:  Hip ROM WNL  LUMBAR SPECIAL TESTS:  Straight leg raise test: Negative and FABER test: Negative  FUNCTIONAL TESTS:  5 times sit to stand: 12.3  seconds  GAIT: Distance walked: 50 Assistive device utilized: None Level of assistance: Complete Independence Comments: no significant gait deviations    TODAY'S TREATMENT  11/28/21- Therapeutic Exercise: to improve strength and mobility.  Demo, verbal and tactile cues throughout for technique. Review of current HEP and progression.  - Supine Lower Trunk Rotation  - 10 reps - Supine Posterior Pelvic Tilt  5 reps - Supine Bridge  -  5 reps - Supine Piriformis Stretch with Foot on Ground  2 x30 sec  hold - Hooklying Hamstring Stretch with Strap  2 reps - 30 sec  hold - Sidelying Hip Abduction -review only - Prone Press Up On Elbows   10 reps - Prone Hip Extension - 10 reps bil - substituted for bird dogs due to UE pain. - Seated Hamstring Stretch 2 reps - 30 sec  hold  PATIENT EDUCATION:  Education details: review and progression of HEP Person educated: Patient Education method: Explanation, Demonstration, Verbal cues, Handouts, and also set up [place] app with videos Education comprehension: verbalized understanding and returned demonstration   HOME EXERCISE PROGRAM: Access Code: [facility] URL: [email or web address] Date: 11/28/2021 Prepared by: Glenetta Hew   ASSESSMENT:  CLINICAL IMPRESSION: Patient is a 76 y.o. female who was seen today for physical therapy evaluation and treatment for chronic low back pain.  She reports only intermittent low back pain and occasional radicular symptoms on R side.  She is currently not in pain and played 18 holes of golf yesterday without any pain.  She has been performing HEP given by her PCP, and demonstrated good form and recall with exercises today.  We did modify one exercise for comfort and added hamstring stretches as well, as her R side is very tight.  Since she is not having pain, she preferred to make this a one time visit and will continue to work on new exercises at home.    OBJECTIVE IMPAIRMENTS decreased ROM,  decreased strength, increased fascial restrictions, impaired flexibility, and postural dysfunction.   ACTIVITY LIMITATIONS standing  and locomotion level  PARTICIPATION LIMITATIONS: community activity  PERSONAL FACTORS 1-2 comorbidities: history chronic LBP, hip replacement left   are also affecting patient's functional outcome.   REHAB POTENTIAL: Excellent  CLINICAL DECISION MAKING: Stable/uncomplicated  EVALUATION COMPLEXITY: Low   GOALS: Goals reviewed with patient? Yes  SHORT TERM GOALS: Target date:  11/28/2021    Patient will be independent with  HEP.  Baseline: reviewed and progressed HEP Goal status: MET   PLAN: PT FREQUENCY: one time visit  PT DURATION:  1 sessions  PLANNED INTERVENTIONS: Therapeutic exercises, Therapeutic activity, Neuromuscular re-education, Balance training, Gait training, Patient/Family education, Self Care, and Joint mobilization.   Rennie Natter, PT, DPT  11/28/2021, 6:01 PM   PHYSICAL THERAPY DISCHARGE SUMMARY  Visits from Start of Care: 1  Current functional level related to goals / functional outcomes: See note above   Remaining deficits: See note above   Education / Equipment: HEP  Plan: Patient agrees to discharge.   Patient does not wish therapy at this time, feels confident in HEP.     Rennie Natter, PT, DPT  11/29/2021 11:02 AM

## 2021-12-01 ENCOUNTER — Other Ambulatory Visit: Payer: Self-pay | Admitting: Gastroenterology

## 2021-12-05 ENCOUNTER — Ambulatory Visit: Payer: PPO | Admitting: Physical Therapy

## 2021-12-17 ENCOUNTER — Encounter: Payer: PPO | Admitting: Physical Therapy

## 2021-12-17 DIAGNOSIS — M9905 Segmental and somatic dysfunction of pelvic region: Secondary | ICD-10-CM | POA: Diagnosis not present

## 2021-12-17 DIAGNOSIS — S336XXA Sprain of sacroiliac joint, initial encounter: Secondary | ICD-10-CM | POA: Diagnosis not present

## 2021-12-17 DIAGNOSIS — M9903 Segmental and somatic dysfunction of lumbar region: Secondary | ICD-10-CM | POA: Diagnosis not present

## 2021-12-17 DIAGNOSIS — M5416 Radiculopathy, lumbar region: Secondary | ICD-10-CM | POA: Diagnosis not present

## 2021-12-17 DIAGNOSIS — M62830 Muscle spasm of back: Secondary | ICD-10-CM | POA: Diagnosis not present

## 2021-12-19 ENCOUNTER — Encounter: Payer: PPO | Admitting: Physical Therapy

## 2021-12-29 ENCOUNTER — Other Ambulatory Visit: Payer: Self-pay | Admitting: Family Medicine

## 2022-03-20 DIAGNOSIS — D3132 Benign neoplasm of left choroid: Secondary | ICD-10-CM | POA: Diagnosis not present

## 2022-05-02 ENCOUNTER — Ambulatory Visit (INDEPENDENT_AMBULATORY_CARE_PROVIDER_SITE_OTHER): Payer: PPO | Admitting: *Deleted

## 2022-05-02 DIAGNOSIS — L570 Actinic keratosis: Secondary | ICD-10-CM | POA: Diagnosis not present

## 2022-05-02 DIAGNOSIS — Z85828 Personal history of other malignant neoplasm of skin: Secondary | ICD-10-CM | POA: Diagnosis not present

## 2022-05-02 DIAGNOSIS — D692 Other nonthrombocytopenic purpura: Secondary | ICD-10-CM | POA: Diagnosis not present

## 2022-05-02 DIAGNOSIS — L578 Other skin changes due to chronic exposure to nonionizing radiation: Secondary | ICD-10-CM | POA: Diagnosis not present

## 2022-05-02 DIAGNOSIS — E663 Overweight: Secondary | ICD-10-CM | POA: Diagnosis not present

## 2022-05-02 DIAGNOSIS — Z0000 Encounter for general adult medical examination without abnormal findings: Secondary | ICD-10-CM | POA: Diagnosis not present

## 2022-05-02 DIAGNOSIS — C44319 Basal cell carcinoma of skin of other parts of face: Secondary | ICD-10-CM | POA: Diagnosis not present

## 2022-05-02 DIAGNOSIS — D485 Neoplasm of uncertain behavior of skin: Secondary | ICD-10-CM | POA: Diagnosis not present

## 2022-05-02 NOTE — Progress Notes (Signed)
Subjective:   Vickie Lopez is a 76 y.o. female who presents for Medicare Annual (Subsequent) preventive examination.  I connected with  KAELAH HAYASHI on 05/02/22 by a audio enabled telemedicine application and verified that I am speaking with the correct person using two identifiers.  Patient Location: Home  Provider Location: Office/Clinic  I discussed the limitations of evaluation and management by telemedicine. The patient expressed understanding and agreed to proceed.   Review of Systems    Defer  Cardiac Risk Factors include: advanced age (>69mn, >>51women);dyslipidemia     Objective:    There were no vitals filed for this visit. There is no height or weight on file to calculate BMI.     05/02/2022   10:26 AM 11/28/2021    2:50 PM 04/09/2021   11:45 AM  Advanced Directives  Does Patient Have a Medical Advance Directive? Yes Yes Yes  Type of [place]of [place] will [place] will [place] will  Does patient want to make changes to medical advance directive? No - Patient declined No - Patient declined   Copy of [place] Chart? No - copy requested No - copy requested No - copy requested    Current Medications (verified) Outpatient Encounter Medications as of 05/02/2022  Medication Sig   estradiol (ESTRACE) 2 MG tablet Take 2 mg by mouth daily.   medroxyPROGESTERone (PROVERA) 2.5 MG tablet Take 2.5 mg by mouth daily.   omeprazole (PRILOSEC) 20 MG capsule Take 1 capsule (20 mg total) by mouth 2 (two) times daily before a meal. Please call [phone] schedule an office visit for more refills   rosuvastatin (CRESTOR) 20 MG tablet TAKE 1 TABLET BY MOUTH EVERY DAY   [DISCONTINUED] diclofenac Sodium (VOLTAREN) 1 % GEL Apply 2 g topically 4 (four) times daily.   [DISCONTINUED] tiZANidine (ZANAFLEX) 4 MG tablet Take 1 tablet (4 mg total) by mouth every 6 (six)  hours as needed for muscle spasms.   No facility-administered encounter medications on file as of 05/02/2022.    Allergies (verified) Patient has no known allergies.   History: Past Medical History:  Diagnosis Date   Barrett's esophagus    Hyperlipidemia    Past Surgical History:  Procedure Laterality Date   COLONOSCOPY WITH ESOPHAGOGASTRODUODENOSCOPY (EGD)  02/07/2016   [facility]   TOTAL HIP ARTHROPLASTY Left 2000   Family History  Problem Relation Age of Onset   Heart attack Mother    Heart attack Father    Cancer Sister        esophageal cancer   Colon cancer Neg Hx    Social History   Socioeconomic History   Marital status: Single    Spouse name: Not on file   Number of children: Not on file   Years of education: Not on file   Highest education level: Not on file  Occupational History   Not on file  Tobacco Use   Smoking status: Former   Smokeless tobacco: Never  Vaping Use   Vaping Use: Never used  Substance and Sexual Activity   Alcohol use: Yes    Comment: ocassionally   Drug use: Never   Sexual activity: Not Currently  Other Topics Concern   Not on file  Social History Narrative   Not on file   Social Determinants of Health   Financial Resource Strain: Low Risk  (04/09/2021)   Overall Financial Resource Strain (CARDIA)  Difficulty of Paying Living Expenses: Not hard at all  Food Insecurity: No Food Insecurity (05/02/2022)   Hunger Vital Sign    Worried About Running Out of Food in the Last Year: Never true    Ran Out of Food in the Last Year: Never true  Transportation Needs: No Transportation Needs (05/02/2022)   PRAPARE - Hydrologist (Medical): No    Lack of Transportation (Non-Medical): No  Physical Activity: Insufficiently Active (04/09/2021)   Exercise Vital Sign    Days of Exercise per Week: 4 days    Minutes of Exercise per Session: 30 min  Stress: No Stress Concern Present (04/09/2021)   [place]    Feeling of Stress : Not at all  Social Connections: Moderately Integrated (04/09/2021)   Social Connection and Isolation Panel [NHANES]    Frequency of Communication with Friends and Family: More than three times a week    Frequency of Social Gatherings with Friends and Family: More than three times a week    Attends Religious Services: More than 4 times per year    Active Member of [employer] or Organizations: Yes    Attends Music therapist: More than 4 times per year    Marital Status: Divorced    Tobacco Counseling Counseling given: Not Answered   Clinical Intake:  Pre-visit preparation completed: Yes  Pain : No/denies pain  Diabetes: No  How often do you need to have someone help you when you read instructions, pamphlets, or other written materials from your doctor or pharmacy?: 1 - Never  Activities of Daily Living    05/02/2022   10:02 AM  In your present state of health, do you have any difficulty performing the following activities:  Hearing? 1  Vision? 0  Difficulty concentrating or making decisions? 0  Walking or climbing stairs? 0  Dressing or bathing? 0  Doing errands, shopping? 0  Preparing Food and eating ? N  Using the Toilet? N  In the past six months, have you accidently leaked urine? N  Do you have problems with loss of bowel control? N  Managing your Medications? N  Managing your Finances? N  Housekeeping or managing your Housekeeping? N    Patient Care Team: Shelda Pal, DO as PCP - General (Family Medicine)  Indicate any recent Medical Services you may have received from other than [facility] providers in the past year (date may be approximate).     Assessment:   This is a routine wellness examination for Vickie Lopez.  Hearing/Vision screen No results found.  Dietary issues and exercise activities discussed: Current Exercise Habits: Home exercise routine,  Type of exercise: strength training/weights;calisthenics, Time (Minutes): 40, Frequency (Times/Week): 5, Weekly Exercise (Minutes/Week): 200, Intensity: Mild, Exercise limited by: None identified   Goals Addressed   None    Depression Screen    05/02/2022   10:25 AM 04/09/2021   11:48 AM 10/18/2020   12:47 PM 02/23/2020   10:54 AM  PHQ 2/9 Scores  PHQ - 2 Score 0 0 0 0    Fall Risk    05/02/2022   10:02 AM 04/09/2021   11:46 AM 10/18/2020   12:46 PM 12/17/2018   11:19 AM  Fall Risk   Falls in the past year? 0 0 0   Comment    Emmi Telephone Survey: data to providers prior to load  Number falls in past yr: 0 0 0  Comment    Emmi Telephone Survey Actual Response =   Injury with Fall? 0 0 0   Risk for fall due to :   No Fall Risks   Follow up  Falls prevention discussed Falls evaluation completed     FALL RISK PREVENTION PERTAINING TO THE HOME:  Any stairs in or around the home? Yes  If so, are there any without handrails? No  Home free of loose throw rugs in walkways, pet beds, electrical cords, etc? Yes  Adequate lighting in your home to reduce risk of falls? Yes   ASSISTIVE DEVICES UTILIZED TO PREVENT FALLS:  Life alert? No  Use of a cane, walker or w/c? No  Grab bars in the bathroom? No  Shower chair or bench in shower? Yes  Elevated toilet seat or a handicapped toilet? Yes   TIMED UP AND GO:  Was the test performed?  No, audio visit .    Cognitive Function:        05/02/2022   10:30 AM  6CIT Screen  What Year? 0 points  What month? 0 points  What time? 0 points  Count back from 20 0 points  Months in reverse 0 points  Repeat phrase 0 points  Total Score 0 points    Immunizations Immunization History  Administered Date(s) Administered   Influenza, High Dose Seasonal PF 03/06/2018, 02/16/2019, 03/01/2022   PFIZER(Purple Top)SARS-COV-2 Vaccination 07/13/2019, 08/03/2019, 04/11/2020, 03/01/2022   Pneumococcal Conjugate-13 05/27/2014, 10/27/2015    Pneumococcal Polysaccharide-23 05/03/2016   Td 10/31/2003   Zoster Recombinat (Shingrix) 06/02/2019    TDAP status: Due, Education has been provided regarding the importance of this vaccine. Advised may receive this vaccine at local pharmacy or Health Dept. Aware to provide a copy of the vaccination record if obtained from local pharmacy or Health Dept. Verbalized acceptance and understanding.  Flu Vaccine status: Up to date  Pneumococcal vaccine status: Up to date  Covid-19 vaccine status: Information provided on how to obtain vaccines.   Qualifies for Shingles Vaccine? Yes   Zostavax completed No   Shingrix Completed?: No.    Education has been provided regarding the importance of this vaccine. Patient has been advised to call insurance company to determine out of pocket expense if they have not yet received this vaccine. Advised may also receive vaccine at local pharmacy or Health Dept. Verbalized acceptance and understanding.  Screening Tests Health Maintenance  Topic Date Due   DTaP/Tdap/Td (2 - Tdap) 10/30/2013   Zoster Vaccines- Shingrix (2 of 2) 07/28/2019   Medicare Annual Wellness (AWV)  04/09/2022   COLONOSCOPY (Pts 45-45yr Insurance coverage will need to be confirmed)  02/06/2026   Pneumonia Vaccine 75 Years old  Completed   INFLUENZA VACCINE  Completed   DEXA SCAN  Completed   Hepatitis C Screening  Completed   HPV VACCINES  Aged Out   COVID-19 Vaccine  Discontinued    Health Maintenance  Health Maintenance Due  Topic Date Due   DTaP/Tdap/Td (2 - Tdap) 10/30/2013   Zoster Vaccines- Shingrix (2 of 2) 07/28/2019   Medicare Annual Wellness (AWV)  04/09/2022    Colorectal cancer screening: Type of screening: Colonoscopy. Completed 02/07/16. Repeat every 10 years  Mammogram status: Completed 10/30/21. Repeat every year  Bone Density status: Completed 04/19/21. Results reflect: Bone density results: NORMAL. Repeat every 2 years.  Lung Cancer Screening: (Low Dose  CT Chest recommended if Age 75-80years, 30 pack-year currently smoking OR have quit w/in 15years.) does not qualify.  Additional Screening:  Hepatitis C Screening: does qualify; Completed 04/19/21  Vision Screening: Recommended annual ophthalmology exams for early detection of glaucoma and other disorders of the eye. Is the patient up to date with their annual eye exam?  Yes  Who is the provider or what is the name of the office in which the patient attends annual eye exams? [facility]. If pt is not established with a provider, would they like to be referred to a provider to establish care? No .   Dental Screening: Recommended annual dental exams for proper oral hygiene  Community Resource Referral / Chronic Care Management: CRR required this visit?  No   CCM required this visit?  No      Plan:     I have personally reviewed and noted the following in the patient's chart:   Medical and social history Use of alcohol, tobacco or illicit drugs  Current medications and supplements including opioid prescriptions. Patient is not currently taking opioid prescriptions. Functional ability and status Nutritional status Physical activity Advanced directives List of other physicians Hospitalizations, surgeries, and ER visits in previous 12 months Vitals Screenings to include cognitive, depression, and falls Referrals and appointments  In addition, I have reviewed and discussed with patient certain preventive protocols, quality metrics, and best practice recommendations. A written personalized care plan for preventive services as well as general preventive health recommendations were provided to patient.   Due to this being a telephonic visit, the after visit summary with patients personalized plan was offered to patient via mail or my-chart. Patient would like to access on my-chart.  Beatris Ship, [place]   05/02/2022   Nurse Notes: None

## 2022-05-02 NOTE — Patient Instructions (Signed)
Ms. Vickie Lopez , Thank you for taking time to come for your Medicare Wellness Visit. I appreciate your ongoing commitment to your health goals. Please review the following plan we discussed and let me know if I can assist you in the future.   These are the goals we discussed:  Goals      Patient Stated     Maintain current health        This is a list of the screening recommended for you and due dates:  Health Maintenance  Topic Date Due   DTaP/Tdap/Td vaccine (2 - Tdap) 10/30/2013   Zoster (Shingles) Vaccine (2 of 2) 07/28/2019   Medicare Annual Wellness Visit  05/03/2023   Colon Cancer Screening  02/06/2026   Pneumonia Vaccine  Completed   Flu Shot  Completed   DEXA scan (bone density measurement)  Completed   Hepatitis C Screening: USPSTF Recommendation to screen - Ages 66-79 yo.  Completed   HPV Vaccine  Aged Out   COVID-19 Vaccine  Discontinued     Next appointment: Follow up in one year for your annual wellness visit.   Preventive Care 19 Years and Older, Female Preventive care refers to lifestyle choices and visits with your health care provider that can promote health and wellness. What does preventive care include? A yearly physical exam. This is also called an annual well check. Dental exams once or twice a year. Routine eye exams. Ask your health care provider how often you should have your eyes checked. Personal lifestyle choices, including: Daily care of your teeth and gums. Regular physical activity. Eating a healthy diet. Avoiding tobacco and drug use. Limiting alcohol use. Practicing safe sex. Taking low-dose aspirin every day. Taking vitamin and mineral supplements as recommended by your health care provider. What happens during an annual well check? The services and screenings done by your health care provider during your annual well check will depend on your age, overall health, lifestyle risk factors, and family history of disease. Counseling  Your  health care provider may ask you questions about your: Alcohol use. Tobacco use. Drug use. Emotional well-being. Home and relationship well-being. Sexual activity. Eating habits. History of falls. Memory and ability to understand (cognition). Work and work Statistician. Reproductive health. Screening  You may have the following tests or measurements: Height, weight, and BMI. Blood pressure. Lipid and cholesterol levels. These may be checked every 5 years, or more frequently if you are over 11 years old. Skin check. Lung cancer screening. You may have this screening every year starting at age 17 if you have a 30-pack-year history of smoking and currently smoke or have quit within the past 15 years. Fecal occult blood test (FOBT) of the stool. You may have this test every year starting at age 35. Flexible sigmoidoscopy or colonoscopy. You may have a sigmoidoscopy every 5 years or a colonoscopy every 10 years starting at age 106. Hepatitis C blood test. Hepatitis B blood test. Sexually transmitted disease (STD) testing. Diabetes screening. This is done by checking your blood sugar (glucose) after you have not eaten for a while (fasting). You may have this done every 1-3 years. Bone density scan. This is done to screen for osteoporosis. You may have this done starting at age 81. Mammogram. This may be done every 1-2 years. Talk to your health care provider about how often you should have regular mammograms. Talk with your health care provider about your test results, treatment options, and if necessary, the need for more  tests. Vaccines  Your health care provider may recommend certain vaccines, such as: Influenza vaccine. This is recommended every year. Tetanus, diphtheria, and acellular pertussis (Tdap, Td) vaccine. You may need a Td booster every 10 years. Zoster vaccine. You may need this after age 36. Pneumococcal 13-valent conjugate (PCV13) vaccine. One dose is recommended after age  73. Pneumococcal polysaccharide (PPSV23) vaccine. One dose is recommended after age 90. Talk to your health care provider about which screenings and vaccines you need and how often you need them. This information is not intended to replace advice given to you by your health care provider. Make sure you discuss any questions you have with your health care provider. Document Released: 06/02/2015 Document Revised: 01/24/2016 Document Reviewed: 03/07/2015 Elsevier Interactive Patient Education  2017 [place] Prevention in the Home Falls can cause injuries. They can happen to people of all ages. There are many things you can do to make your home safe and to help prevent falls. What can I do on the outside of my home? Regularly fix the edges of walkways and driveways and fix any cracks. Remove anything that might make you trip as you walk through a door, such as a raised step or threshold. Trim any bushes or trees on the path to your home. Use bright outdoor lighting. Clear any walking paths of anything that might make someone trip, such as rocks or tools. Regularly check to see if handrails are loose or broken. Make sure that both sides of any steps have handrails. Any raised decks and porches should have guardrails on the edges. Have any leaves, snow, or ice cleared regularly. Use sand or salt on walking paths during winter. Clean up any spills in your garage right away. This includes oil or grease spills. What can I do in the bathroom? Use night lights. Install grab bars by the toilet and in the tub and shower. Do not use towel bars as grab bars. Use non-skid mats or decals in the tub or shower. If you need to sit down in the shower, use a plastic, non-slip stool. Keep the floor dry. Clean up any water that spills on the floor as soon as it happens. Remove soap buildup in the tub or shower regularly. Attach bath mats securely with double-sided non-slip rug tape. Do not have throw  rugs and other things on the floor that can make you trip. What can I do in the bedroom? Use night lights. Make sure that you have a light by your bed that is easy to reach. Do not use any sheets or blankets that are too big for your bed. They should not hang down onto the floor. Have a firm chair that has side arms. You can use this for support while you get dressed. Do not have throw rugs and other things on the floor that can make you trip. What can I do in the kitchen? Clean up any spills right away. Avoid walking on wet floors. Keep items that you use a lot in easy-to-reach places. If you need to reach something above you, use a strong step stool that has a grab bar. Keep electrical cords out of the way. Do not use floor polish or wax that makes floors slippery. If you must use wax, use non-skid floor wax. Do not have throw rugs and other things on the floor that can make you trip. What can I do with my stairs? Do not leave any items on the stairs. Make sure  that there are handrails on both sides of the stairs and use them. Fix handrails that are broken or loose. Make sure that handrails are as long as the stairways. Check any carpeting to make sure that it is firmly attached to the stairs. Fix any carpet that is loose or worn. Avoid having throw rugs at the top or bottom of the stairs. If you do have throw rugs, attach them to the floor with carpet tape. Make sure that you have a light switch at the top of the stairs and the bottom of the stairs. If you do not have them, ask someone to add them for you. What else can I do to help prevent falls? Wear shoes that: Do not have high heels. Have rubber bottoms. Are comfortable and fit you well. Are closed at the toe. Do not wear sandals. If you use a stepladder: Make sure that it is fully opened. Do not climb a closed stepladder. Make sure that both sides of the stepladder are locked into place. Ask someone to hold it for you, if  possible. Clearly mark and make sure that you can see: Any grab bars or handrails. First and last steps. Where the edge of each step is. Use tools that help you move around (mobility aids) if they are needed. These include: Canes. Walkers. Scooters. Crutches. Turn on the lights when you go into a dark area. Replace any light bulbs as soon as they burn out. Set up your furniture so you have a clear path. Avoid moving your furniture around. If any of your floors are uneven, fix them. If there are any pets around you, be aware of where they are. Review your medicines with your doctor. Some medicines can make you feel dizzy. This can increase your chance of falling. Ask your doctor what other things that you can do to help prevent falls. This information is not intended to replace advice given to you by your health care provider. Make sure you discuss any questions you have with your health care provider. Document Released: 03/02/2009 Document Revised: 10/12/2015 Document Reviewed: 06/10/2014 Elsevier Interactive Patient Education  2017 [employer].

## 2022-05-03 ENCOUNTER — Encounter: Payer: Self-pay | Admitting: Family Medicine

## 2022-05-15 DIAGNOSIS — K2270 Barrett's esophagus without dysplasia: Secondary | ICD-10-CM | POA: Diagnosis not present

## 2022-05-15 DIAGNOSIS — Z9849 Cataract extraction status, unspecified eye: Secondary | ICD-10-CM | POA: Diagnosis not present

## 2022-05-15 DIAGNOSIS — K219 Gastro-esophageal reflux disease without esophagitis: Secondary | ICD-10-CM | POA: Diagnosis not present

## 2022-05-15 DIAGNOSIS — C4491 Basal cell carcinoma of skin, unspecified: Secondary | ICD-10-CM | POA: Diagnosis not present

## 2022-05-15 DIAGNOSIS — E785 Hyperlipidemia, unspecified: Secondary | ICD-10-CM | POA: Diagnosis not present

## 2022-05-15 DIAGNOSIS — Z87891 Personal history of nicotine dependence: Secondary | ICD-10-CM | POA: Diagnosis not present

## 2022-05-15 DIAGNOSIS — N951 Menopausal and female climacteric states: Secondary | ICD-10-CM | POA: Diagnosis not present

## 2022-05-15 DIAGNOSIS — E663 Overweight: Secondary | ICD-10-CM | POA: Diagnosis not present

## 2022-06-13 DIAGNOSIS — D485 Neoplasm of uncertain behavior of skin: Secondary | ICD-10-CM | POA: Diagnosis not present

## 2022-06-13 DIAGNOSIS — L570 Actinic keratosis: Secondary | ICD-10-CM | POA: Diagnosis not present

## 2022-06-13 DIAGNOSIS — C44319 Basal cell carcinoma of skin of other parts of face: Secondary | ICD-10-CM | POA: Diagnosis not present

## 2022-06-20 DIAGNOSIS — L905 Scar conditions and fibrosis of skin: Secondary | ICD-10-CM | POA: Diagnosis not present

## 2022-07-03 ENCOUNTER — Other Ambulatory Visit: Payer: Self-pay | Admitting: Family Medicine

## 2022-07-23 DIAGNOSIS — Z01419 Encounter for gynecological examination (general) (routine) without abnormal findings: Secondary | ICD-10-CM | POA: Diagnosis not present

## 2022-07-31 ENCOUNTER — Encounter: Payer: Self-pay | Admitting: Family Medicine

## 2022-08-01 ENCOUNTER — Encounter: Payer: Self-pay | Admitting: Family Medicine

## 2022-08-01 ENCOUNTER — Ambulatory Visit (INDEPENDENT_AMBULATORY_CARE_PROVIDER_SITE_OTHER): Payer: PPO | Admitting: Family Medicine

## 2022-08-01 VITALS — BP 126/59 | HR 67 | Temp 98.0°F | Resp 18 | Ht 62.0 in | Wt 143.0 lb

## 2022-08-01 DIAGNOSIS — B351 Tinea unguium: Secondary | ICD-10-CM | POA: Diagnosis not present

## 2022-08-01 LAB — COMPREHENSIVE METABOLIC PANEL
ALT: 9 U/L (ref 0–35)
AST: 14 U/L (ref 0–37)
Albumin: 4.2 g/dL (ref 3.5–5.2)
Alkaline Phosphatase: 40 U/L (ref 39–117)
BUN: 11 mg/dL (ref 6–23)
CO2: 24 mEq/L (ref 19–32)
Calcium: 9.9 mg/dL (ref 8.4–10.5)
Chloride: 106 mEq/L (ref 96–112)
Creatinine, Ser: 0.89 mg/dL (ref 0.40–1.20)
GFR: 63.48 mL/min (ref >60.00)
Glucose, Bld: 89 mg/dL (ref 70–99)
Potassium: 4.4 mEq/L (ref 3.5–5.1)
Sodium: 139 mEq/L (ref 135–145)
Total Bilirubin: 0.5 mg/dL (ref 0.2–1.2)
Total Protein: 6.8 g/dL (ref 6.0–8.3)

## 2022-08-01 MED ORDER — TERBINAFINE HCL 250 MG PO TABS: 250.0000 mg | ORAL_TABLET | Freq: Every day | ORAL | 1 refills | Status: DC

## 2022-08-01 NOTE — Patient Instructions (Signed)
Moderate/Severe nail fungus not responding to over-the-counter topical treatment. Patient prefers to try oral medication. Starting terbinafine and checking CMP today. Recommend f/u with PCP in 6-8 weeks to monitor progress/labs.

## 2022-08-01 NOTE — Progress Notes (Signed)
   Acute Office Visit  Subjective:     Patient ID: Vickie Lopez, female    DOB: April 06, 1946, 77 y.o.   MRN: [id number]  Chief Complaint  Patient presents with   Nail Problem    The nail fungus is on the L great toe.  Concerns/ questions: pt says she has developed a tremor x 6 months and is worsening and she says she can't write her name anymore.     HPI Patient is in today for toenail fungus.   Patient reports she has had about a year of left great toe nail fungus. States it has gotten worse and nail is very thick and discolored now. No pain. She hasn't gotten any improvement with over-the-counter topical treatment.    She briefly mentions a right hand tremor when writing her name. States she has discussed essential tremor with PCP. No other symptoms. Reports her father had the same thing.     ROS All review of systems negative except what is listed in the HPI      Objective:    BP (!) 126/59 (BP Location: Left Arm, Patient Position: Sitting, Cuff Size: Normal)   Pulse 67   Temp 98 F (36.7 C) (Temporal)   Resp 18   Ht 5\' 2"  (1.575 m)   Wt 143 lb (64.9 kg)   SpO2 100%   BMI 26.16 kg/m    Physical Exam Vitals reviewed.  Constitutional:      Appearance: Normal appearance.  Feet:     Comments: Left great toe with mod/severe onychomycosis, discoloration and nail thickening; no surrounding erythema, edema, wounds, or drainage Skin:    General: Skin is warm and dry.  Neurological:     General: No focal deficit present.     Mental Status: She is alert and oriented to person, place, and time. Mental status is at baseline.     Comments: Very mild right hand tremor with fine motor activities  Psychiatric:        Mood and Affect: Mood normal.        Behavior: Behavior normal.        Thought Content: Thought content normal.        Judgment: Judgment normal.     No results found for any visits on 08/01/22.      Assessment & Plan:   Problem List Items Addressed  This Visit   None Visit Diagnoses     Onychomycosis    -  Primary Moderate/Severe nail fungus not responding to over-the-counter topical treatment. Patient prefers to try oral medication. Starting terbinafine and checking baseline CMP today. Recommend f/u with PCP in 6-8 weeks to monitor progress/labs.     Relevant Medications   terbinafine (LAMISIL) 250 MG tablet   Other Relevant Orders   Comp Met (CMET)       Meds ordered this encounter  Medications   terbinafine (LAMISIL) 250 MG tablet    Sig: Take 1 tablet (250 mg total) by mouth daily.    Dispense:  30 tablet    Refill:  1    Order Specific Question:   Supervising Provider    Answer:   Mosie Lukes [[id number]]    Return for CPE with PCP in 6-8 weeks if possible to recheck cmp with new terbinafine .  Terrilyn Saver, NP

## 2022-09-18 ENCOUNTER — Ambulatory Visit (INDEPENDENT_AMBULATORY_CARE_PROVIDER_SITE_OTHER): Payer: PPO | Admitting: Family Medicine

## 2022-09-18 ENCOUNTER — Encounter: Payer: Self-pay | Admitting: Family Medicine

## 2022-09-18 VITALS — BP 128/62 | HR 50 | Temp 98.3°F | Ht 62.0 in | Wt 139.2 lb

## 2022-09-18 DIAGNOSIS — Z0001 Encounter for general adult medical examination with abnormal findings: Secondary | ICD-10-CM | POA: Diagnosis not present

## 2022-09-18 DIAGNOSIS — M545 Low back pain, unspecified: Secondary | ICD-10-CM | POA: Diagnosis not present

## 2022-09-18 DIAGNOSIS — Z0000 Encounter for general adult medical examination without abnormal findings: Secondary | ICD-10-CM

## 2022-09-18 DIAGNOSIS — R251 Tremor, unspecified: Secondary | ICD-10-CM

## 2022-09-18 DIAGNOSIS — E785 Hyperlipidemia, unspecified: Secondary | ICD-10-CM

## 2022-09-18 DIAGNOSIS — G8929 Other chronic pain: Secondary | ICD-10-CM | POA: Diagnosis not present

## 2022-09-18 LAB — COMPREHENSIVE METABOLIC PANEL
ALT: 8 U/L (ref 0–35)
AST: 13 U/L (ref 0–37)
Albumin: 4.2 g/dL (ref 3.5–5.2)
Alkaline Phosphatase: 78 U/L (ref 39–117)
BUN: 15 mg/dL (ref 6–23)
CO2: 24 mEq/L (ref 19–32)
Calcium: 9.6 mg/dL (ref 8.4–10.5)
Chloride: 105 mEq/L (ref 96–112)
Creatinine, Ser: 0.91 mg/dL (ref 0.40–1.20)
GFR: 61.75 mL/min (ref >60.00)
Glucose, Bld: 84 mg/dL (ref 70–99)
Potassium: 4.3 mEq/L (ref 3.5–5.1)
Sodium: 138 mEq/L (ref 135–145)
Total Bilirubin: 0.5 mg/dL (ref 0.2–1.2)
Total Protein: 6.9 g/dL (ref 6.0–8.3)

## 2022-09-18 LAB — LIPID PANEL
Cholesterol: 170 mg/dL (ref 0–200)
HDL: 73.7 mg/dL (ref >39.00)
LDL Cholesterol: 81 mg/dL (ref 0–99)
NonHDL: 96.33
Total CHOL/HDL Ratio: 2
Triglycerides: 78 mg/dL (ref 0.0–149.0)
VLDL: 15.6 mg/dL (ref 0.0–40.0)

## 2022-09-18 NOTE — Progress Notes (Signed)
Chief Complaint  Patient presents with   Annual Exam    Tremor in her right hand     Well Woman Vickie Lopez is here for a complete physical.   Her last physical was >1 year ago.  Current diet: in general, diet could be better. Current exercise: golfing. Weight is stable and she confirms daytime fatigue in the afternoon. Seatbelt? Yes Advanced directive? Yes  Health Maintenance Colonoscopy- Yes Shingrix- Yes DEXA- Yes Mammogram- Yes Tetanus- Due Pneumonia- Yes Hep C screen- Yes  Has a tremor in the R hand, worse in the afternoon. Affecting her writing. Going on for around a year and steadily getting worse. No pain or weakness. Dad had same thing in his later years.   Hx of low back pain. Gets worse when she does housework and improves when she lays down for 10-15 min. No neuro s/s's. She feels she is overall weaker than last year. Exercising less frequently. Has been to PT in the past and does not wish to return. No bowel/bladder incontinence.   Past Medical History:  Diagnosis Date   Barrett's esophagus    Hyperlipidemia      Past Surgical History:  Procedure Laterality Date   COLONOSCOPY WITH ESOPHAGOGASTRODUODENOSCOPY (EGD)  02/07/2016   [facility]   TOTAL HIP ARTHROPLASTY Left 2000    Medications  Current Outpatient Medications on File Prior to Visit  Medication Sig Dispense Refill   estradiol (ESTRACE) 2 MG tablet Take 2 mg by mouth daily.     medroxyPROGESTERone (PROVERA) 2.5 MG tablet Take 2.5 mg by mouth daily.     omeprazole (PRILOSEC) 20 MG capsule Take 1 capsule (20 mg total) by mouth 2 (two) times daily before a meal. Please call [phone] to schedule an office visit for more refills 180 capsule 0   terbinafine (LAMISIL) 250 MG tablet Take 1 tablet (250 mg total) by mouth daily. 30 tablet 1    Allergies No Known Allergies  Review of Systems: Constitutional:  no fevers Eye:  no recent significant change in vision Ears:  No changes in  hearing Nose/Mouth/Throat:  no complaints of nasal congestion, no sore throat Cardiovascular: no chest pain Respiratory:  No shortness of breath Gastrointestinal:  No change in bowel habits GU:  Female: negative for dysuria Integumentary:  no abnormal skin lesions reported Neurologic:  no headaches Endocrine:  denies unexplained weight changes  Exam BP 128/62 (BP Location: Right Arm, Patient Position: Sitting, Cuff Size: Normal)   Pulse (!) 50   Temp 98.3 F (36.8 C) (Oral)   Ht 5\' 2"  (1.575 m)   Wt 139 lb 4 oz (63.2 kg)   SpO2 99%   BMI 25.47 kg/m  General:  well developed, well nourished, in no apparent distress Skin:  no significant moles, warts, or growths Head:  no masses, lesions, or tenderness Eyes:  pupils equal and round, sclera anicteric without injection Ears:  canals without lesions, TMs shiny without retraction, no obvious effusion, no erythema Nose:  nares patent, mucosa normal, and no drainage Throat/Pharynx:  lips and gingiva without lesion; tongue and uvula midline; non-inflamed pharynx; no exudates or postnasal drainage Neck: neck supple without adenopathy, thyromegaly, or masses Lungs:  clear to auscultation, breath sounds equal bilaterally, no respiratory distress Cardio:  regular rhythm, bradycardic, no bruits or LE edema Abdomen:  abdomen soft, nontender; bowel sounds normal; no masses or organomegaly Genital: Deferred Neuro:  gait cautious but relatively normal paced; intention tremor noted b/l, worse on the R; deep  tendon reflexes normal and symmetric Psych: well oriented with normal range of affect and appropriate judgment/insight  Assessment and Plan  Well adult exam  Hyperlipidemia, unspecified hyperlipidemia type - Plan: Comprehensive metabolic panel, Lipid panel  Tremor  Chronic bilateral low back pain without sciatica   Well 77 y.o. female. Counseled on diet and exercise. Advanced directive form requested today.  Other orders as  above. 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 Offered medication but she does not feel it is bad enough to start something yet. She will message me if she changes her mind.  Back pain: Chronic, relatively stable. Tylenol prn. Heat, ice, stretches/exercises. Offered PT but she does not want to do it. Promised to do strengthening exercises in gym.  Follow up in 1 yr or prn. The patient voiced understanding and agreement to the plan.  Vickie Lopez [place], DO 09/18/22 9:29 AM

## 2022-09-18 NOTE — Patient Instructions (Addendum)
Give [place] 2-3 business days to get the results of your labs back.   Keep the diet clean and stay active.  Please get me a copy of your advanced directive form at your convenience.   Heat (pad or rice pillow in microwave) over affected area, 10-15 minutes twice daily.   If you want to start a medicine for your tremor, let me know.   Let us know if you need anything.  EXERCISES  RANGE OF MOTION (ROM) AND STRETCHING EXERCISES - Low Back Pain Most people with lower back pain will find that their symptoms get worse with excessive bending forward (flexion) or arching at the lower back (extension). The exercises that will help resolve your symptoms will focus on the opposite motion.  If you have pain, numbness or tingling which travels down into your buttocks, leg or foot, the goal of the therapy is for these symptoms to move closer to your back and eventually resolve. Sometimes, these leg symptoms will get better, but your lower back pain may worsen. This is often an indication of progress in your rehabilitation. Be very alert to any changes in your symptoms and the activities in which you participated in the 24 hours prior to the change. Sharing this information with your caregiver will allow him or her to most efficiently treat your condition. These exercises may help you when beginning to rehabilitate your injury. Your symptoms may resolve with or without further involvement from your physician, physical therapist or athletic trainer. While completing these exercises, remember:  Restoring tissue flexibility helps normal motion to return to the joints. This allows healthier, less painful movement and activity. An effective stretch should be held for at least 30 seconds. A stretch should never be painful. You should only feel a gentle lengthening or release in the stretched tissue. FLEXION RANGE OF MOTION AND STRETCHING EXERCISES:  STRETCH - Flexion, Single Knee to Chest  Lie on a firm bed or floor  with both legs extended in front of you. Keeping one leg in contact with the floor, bring your opposite knee to your chest. Hold your leg in place by either grabbing behind your thigh or at your knee. Pull until you feel a gentle stretch in your low back. Hold 30 seconds. Slowly release your grasp and repeat the exercise with the opposite side. Repeat 2 times. Complete this exercise 3 times per week.   STRETCH - Flexion, Double Knee to Chest Lie on a firm bed or floor with both legs extended in front of you. Keeping one leg in contact with the floor, bring your opposite knee to your chest. Tense your stomach muscles to support your back and then lift your other knee to your chest. Hold your legs in place by either grabbing behind your thighs or at your knees. Pull both knees toward your chest until you feel a gentle stretch in your low back. Hold 30 seconds. Tense your stomach muscles and slowly return one leg at a time to the floor. Repeat 2 times. Complete this exercise 3 times per week.   STRETCH - Low Trunk Rotation Lie on a firm bed or floor. Keeping your legs in front of you, bend your knees so they are both pointed toward the ceiling and your feet are flat on the floor. Extend your arms out to the side. This will stabilize your upper body by keeping your shoulders in contact with the floor. Gently and slowly drop both knees together to one side until you feel  a gentle stretch in your low back. Hold for 30 seconds. Tense your stomach muscles to support your lower back as you bring your knees back to the starting position. Repeat the exercise to the other side. Repeat 2 times. Complete this exercise at least 3 times per week.   EXTENSION RANGE OF MOTION AND FLEXIBILITY EXERCISES:  STRETCH - Extension, Prone on Elbows  Lie on your stomach on the floor, a bed will be too soft. Place your palms about shoulder width apart and at the height of your head. Place your elbows under your  shoulders. If this is too painful, stack pillows under your chest. Allow your body to relax so that your hips drop lower and make contact more completely with the floor. Hold this position for 30 seconds. Slowly return to lying flat on the floor. Repeat 2 times. Complete this exercise 3 times per week.   RANGE OF MOTION - Extension, Prone Press Ups Lie on your stomach on the floor, a bed will be too soft. Place your palms about shoulder width apart and at the height of your head. Keeping your back as relaxed as possible, slowly straighten your elbows while keeping your hips on the floor. You may adjust the placement of your hands to maximize your comfort. As you gain motion, your hands will come more underneath your shoulders. Hold this position 30 seconds. Slowly return to lying flat on the floor. Repeat 2 times. Complete this exercise 3 times per week.   RANGE OF MOTION- Quadruped, Neutral Spine  Assume a hands and knees position on a firm surface. Keep your hands under your shoulders and your knees under your hips. You may place padding under your knees for comfort. Drop your head and point your tailbone toward the ground below you. This will round out your lower back like an angry cat. Hold this position for 30 seconds. Slowly lift your head and release your tail bone so that your back sags into a large arch, like an old horse. Hold this position for 30 seconds. Repeat this until you feel limber in your low back. Now, find your "sweet spot." This will be the most comfortable position somewhere between the two previous positions. This is your neutral spine. Once you have found this position, tense your stomach muscles to support your low back. Hold this position for 30 seconds. Repeat 2 times. Complete this exercise 3 times per week.   STRENGTHENING EXERCISES - Low Back Sprain These exercises may help you when beginning to rehabilitate your injury. These exercises should be done near your  "sweet spot." This is the neutral, low-back arch, somewhere between fully rounded and fully arched, that is your least painful position. When performed in this safe range of motion, these exercises can be used for people who have either a flexion or extension based injury. These exercises may resolve your symptoms with or without further involvement from your physician, physical therapist or athletic trainer. While completing these exercises, remember:  Muscles can gain both the endurance and the strength needed for everyday activities through controlled exercises. Complete these exercises as instructed by your physician, physical therapist or athletic trainer. Increase the resistance and repetitions only as guided. You may experience muscle soreness or fatigue, but the pain or discomfort you are trying to eliminate should never worsen during these exercises. If this pain does worsen, stop and make certain you are following the directions exactly. If the pain is still present after adjustments, discontinue the exercise until  you can discuss the trouble with your caregiver.  STRENGTHENING - Deep Abdominals, Pelvic Tilt  Lie on a firm bed or floor. Keeping your legs in front of you, bend your knees so they are both pointed toward the ceiling and your feet are flat on the floor. Tense your lower abdominal muscles to press your low back into the floor. This motion will rotate your pelvis so that your tail bone is scooping upwards rather than pointing at your feet or into the floor. With a gentle tension and even breathing, hold this position for 3 seconds. Repeat 2 times. Complete this exercise 3 times per week.   STRENGTHENING - Abdominals, Crunches  Lie on a firm bed or floor. Keeping your legs in front of you, bend your knees so they are both pointed toward the ceiling and your feet are flat on the floor. Cross your arms over your chest. Slightly tip your chin down without bending your neck. Tense your  abdominals and slowly lift your trunk high enough to just clear your shoulder blades. Lifting higher can put excessive stress on the lower back and does not further strengthen your abdominal muscles. Control your return to the starting position. Repeat 2 times. Complete this exercise 3 times per week.   STRENGTHENING - Quadruped, Opposite UE/LE Lift  Assume a hands and knees position on a firm surface. Keep your hands under your shoulders and your knees under your hips. You may place padding under your knees for comfort. Find your neutral spine and gently tense your abdominal muscles so that you can maintain this position. Your shoulders and hips should form a rectangle that is parallel with the floor and is not twisted. Keeping your trunk steady, lift your right hand no higher than your shoulder and then your left leg no higher than your hip. Make sure you are not holding your breath. Hold this position for 30 seconds. Continuing to keep your abdominal muscles tense and your back steady, slowly return to your starting position. Repeat with the opposite arm and leg. Repeat 2 times. Complete this exercise 3 times per week.   STRENGTHENING - Abdominals and Quadriceps, Straight Leg Raise  Lie on a firm bed or floor with both legs extended in front of you. Keeping one leg in contact with the floor, bend the other knee so that your foot can rest flat on the floor. Find your neutral spine, and tense your abdominal muscles to maintain your spinal position throughout the exercise. Slowly lift your straight leg off the floor about 6 inches for a count of 3, making sure to not hold your breath. Still keeping your neutral spine, slowly lower your leg all the way to the floor. Repeat this exercise with each leg 2 times. Complete this exercise 3 times per week.  POSTURE AND BODY MECHANICS CONSIDERATIONS - Low Back Sprain Keeping correct posture when sitting, standing or completing your activities will reduce  the stress put on different body tissues, allowing injured tissues a chance to heal and limiting painful experiences. The following are general guidelines for improved posture.  While reading these guidelines, remember: The exercises prescribed by your provider will help you have the flexibility and strength to maintain correct postures. The correct posture provides the best environment for your joints to work. All of your joints have less wear and tear when properly supported by a spine with good posture. This means you will experience a healthier, less painful body. Correct posture must be practiced with all  of your activities, especially prolonged sitting and standing. Correct posture is as important when doing repetitive low-stress activities (typing) as it is when doing a single heavy-load activity (lifting).  RESTING POSITIONS Consider which positions are most painful for you when choosing a resting position. If you have pain with flexion-based activities (sitting, bending, stooping, squatting), choose a position that allows you to rest in a less flexed posture. You would want to avoid curling into a fetal position on your side. If your pain worsens with extension-based activities (prolonged standing, working overhead), avoid resting in an extended position such as sleeping on your stomach. Most people will find more comfort when they rest with their spine in a more neutral position, neither too rounded nor too arched. Lying on a non-sagging bed on your side with a pillow between your knees, or on your back with a pillow under your knees will often provide some relief. Keep in mind, being in any one position for a prolonged period of time, no matter how correct your posture, can still lead to stiffness.  PROPER SITTING POSTURE In order to minimize stress and discomfort on your spine, you must sit with correct posture. Sitting with good posture should be effortless for a healthy body. Returning to good  posture is a gradual process. Many people can work toward this most comfortably by using various supports until they have the flexibility and strength to maintain this posture on their own. When sitting with proper posture, your ears will fall over your shoulders and your shoulders will fall over your hips. You should use the back of the chair to support your upper back. Your lower back will be in a neutral position, just slightly arched. You may place a small pillow or folded towel at the base of your lower back for  support.  When working at a desk, create an environment that supports good, upright posture. Without extra support, muscles tire, which leads to excessive strain on joints and other tissues. Keep these recommendations in mind:  CHAIR: A chair should be able to slide under your desk when your back makes contact with the back of the chair. This allows you to work closely. The chair's height should allow your eyes to be level with the upper part of your monitor and your hands to be slightly lower than your elbows.  BODY POSITION Your feet should make contact with the floor. If this is not possible, use a foot rest. Keep your ears over your shoulders. This will reduce stress on your neck and low back.  INCORRECT SITTING POSTURES  If you are feeling tired and unable to assume a healthy sitting posture, do not slouch or slump. This puts excessive strain on your back tissues, causing more damage and pain. Healthier options include: Using more support, like a lumbar pillow. Switching tasks to something that requires you to be upright or walking. Talking a brief walk. Lying down to rest in a neutral-spine position.  PROLONGED STANDING WHILE SLIGHTLY LEANING FORWARD  When completing a task that requires you to lean forward while standing in one place for a long time, place either foot up on a stationary 2-4 inch high object to help maintain the best posture. When both feet are on the ground,  the lower back tends to lose its slight inward curve. If this curve flattens (or becomes too large), then the back and your other joints will experience too much stress, tire more quickly, and can cause pain.  CORRECT STANDING  POSTURES Proper standing posture should be assumed with all daily activities, even if they only take a few moments, like when brushing your teeth. As in sitting, your ears should fall over your shoulders and your shoulders should fall over your hips. You should keep a slight tension in your abdominal muscles to brace your spine. Your tailbone should point down to the ground, not behind your body, resulting in an over-extended swayback posture.   INCORRECT STANDING POSTURES  Common incorrect standing postures include a forward head, locked knees and/or an excessive swayback. WALKING Walk with an upright posture. Your ears, shoulders and hips should all line-up.  PROLONGED ACTIVITY IN A FLEXED POSITION When completing a task that requires you to bend forward at your waist or lean over a low surface, try to find a way to stabilize 3 out of 4 of your limbs. You can place a hand or elbow on your thigh or rest a knee on the surface you are reaching across. This will provide you more stability, so that your muscles do not tire as quickly. By keeping your knees relaxed, or slightly bent, you will also reduce stress across your lower back. CORRECT LIFTING TECHNIQUES  DO : Assume a wide stance. This will provide you more stability and the opportunity to get as close as possible to the object which you are lifting. Tense your abdominals to brace your spine. Bend at the knees and hips. Keeping your back locked in a neutral-spine position, lift using your leg muscles. Lift with your legs, keeping your back straight. Test the weight of unknown objects before attempting to lift them. Try to keep your elbows locked down at your sides in order get the best strength from your shoulders when  carrying an object.   Always ask for help when lifting heavy or awkward objects. INCORRECT LIFTING TECHNIQUES DO NOT:  Lock your knees when lifting, even if it is a small object. Bend and twist. Pivot at your feet or move your feet when needing to change directions. Assume that you can safely pick up even a paperclip without proper posture.

## 2022-09-23 ENCOUNTER — Encounter: Payer: Self-pay | Admitting: Family Medicine

## 2022-10-01 ENCOUNTER — Other Ambulatory Visit: Payer: Self-pay | Admitting: Family Medicine

## 2022-10-01 DIAGNOSIS — B351 Tinea unguium: Secondary | ICD-10-CM

## 2022-10-02 ENCOUNTER — Encounter: Payer: Self-pay | Admitting: Family Medicine

## 2022-10-02 ENCOUNTER — Other Ambulatory Visit: Payer: Self-pay | Admitting: Family Medicine

## 2022-10-02 DIAGNOSIS — B351 Tinea unguium: Secondary | ICD-10-CM

## 2022-10-02 MED ORDER — TERBINAFINE HCL 250 MG PO TABS: 250.0000 mg | ORAL_TABLET | Freq: Every day | ORAL | 3 refills | Status: DC

## 2022-10-04 ENCOUNTER — Encounter: Payer: Self-pay | Admitting: Family Medicine

## 2022-10-04 ENCOUNTER — Other Ambulatory Visit: Payer: Self-pay | Admitting: Family Medicine

## 2022-10-04 MED ORDER — PROPRANOLOL HCL ER 60 MG PO CP24: 60.0000 mg | ORAL_CAPSULE | Freq: Every day | ORAL | 0 refills | Status: DC

## 2022-10-27 ENCOUNTER — Other Ambulatory Visit: Payer: Self-pay | Admitting: Family Medicine

## 2022-10-29 ENCOUNTER — Other Ambulatory Visit: Payer: Self-pay | Admitting: Family Medicine

## 2022-11-04 ENCOUNTER — Encounter: Payer: Self-pay | Admitting: Family Medicine

## 2022-11-04 ENCOUNTER — Ambulatory Visit (INDEPENDENT_AMBULATORY_CARE_PROVIDER_SITE_OTHER): Payer: PPO | Admitting: Family Medicine

## 2022-11-04 VITALS — BP 122/65 | HR 62 | Temp 98.2°F | Resp 18 | Wt 138.0 lb

## 2022-11-04 DIAGNOSIS — R251 Tremor, unspecified: Secondary | ICD-10-CM

## 2022-11-04 DIAGNOSIS — G8929 Other chronic pain: Secondary | ICD-10-CM

## 2022-11-04 DIAGNOSIS — B351 Tinea unguium: Secondary | ICD-10-CM | POA: Diagnosis not present

## 2022-11-04 DIAGNOSIS — M545 Low back pain, unspecified: Secondary | ICD-10-CM | POA: Diagnosis not present

## 2022-11-04 NOTE — Patient Instructions (Signed)
If you do not hear anything about your referral in the next 1-2 weeks, call our office and ask for an update.  Heat (pad or rice pillow in microwave) over affected area, 10-15 minutes twice daily.   Ice/cold pack over area for 10-15 min twice daily.  OK to take Tylenol 1000 mg (2 extra strength tabs) or 975 mg (3 regular strength tabs) every 6 hours as needed.  Continue the stretches/exercises.  Give [place] 2-3 business days to get the results of your labs back.   Let us know if you need anything.

## 2022-11-04 NOTE — Progress Notes (Signed)
Musculoskeletal Exam  Patient: Vickie Lopez DOB: [birth year]  DOS: 11/04/2022  SUBJECTIVE:  Chief Complaint:   Chief Complaint  Patient presents with   Nail Problem    Left great toe   Back Pain    Lower back onset a month ago, has gotten worst in the past two weeks    Vickie Lopez is a 77 y.o.  female for evaluation and treatment of back pain.   Onset:  2 months ago. No initial injury.  Location: lower Character:  aching and dull  Progression of issue:  has worsened after [place] the beach Associated symptoms: affects her when she stands for periods of time or gets up No bruising, redness, swelling, bowel/bladder incontinence or weakness Treatment: to date has been home exercises and laying flat.   Neurovascular symptoms: no  L great toenail Slightly better since starting the Lamisil. Needs to have her liver enzymes checked.  She has only been on it for a month.  No pain, redness of the toe, or drainage.  Tremor Started on propranolol LA 60 mg daily.  Reports compliance and no adverse effects.  She feels her tremor is improved.  Does not wish to change anything.  Past Medical History:  Diagnosis Date   Barrett's esophagus    Hyperlipidemia     Objective:  VITAL SIGNS: BP 122/65 (BP Location: Left Arm, Patient Position: Sitting, Cuff Size: Normal)   Pulse 62   Temp 98.2 F (36.8 C) (Oral)   Resp 18   Wt 138 lb (62.6 kg)   SpO2 99%   BMI 25.24 kg/m  Constitutional: Well formed, well developed. No acute distress. HENT: Normocephalic, atraumatic.  Thorax & Lungs:  No accessory muscle use Musculoskeletal: low back.   Tenderness to palpation: no Deformity: no Ecchymosis: no Straight leg test: negative for Poor hamstring flexibility b/l. Neurologic: Normal sensory function. No focal deficits noted. DTR's equal and symmetric in LE's. No clonus.  No resting tremor noted.  Gait is cautious.   Psychiatric: Normal mood. Age appropriate judgment and  insight. Alert & oriented x 3.    Assessment:  Chronic bilateral low back pain without sciatica - Plan: Ambulatory referral to Physical Therapy  Onychomycosis - Plan: Hepatic function panel  Tremor  Plan: 1.  Refer to physical therapy.  Stretches/exercises, heat, ice, Tylenol. 2.  Chronic, improving.  Continue terbinafine 250 mg daily.  Check liver function. 3.  Chronic, stable.  Continue propranolol LA 60 mg daily. F/u in 6 months for a medication check. The patient voiced understanding and agreement to the plan.   [name], DO 11/04/22  4:38 PM

## 2022-11-05 LAB — HEPATIC FUNCTION PANEL
ALT: 6 U/L (ref 0–35)
AST: 12 U/L (ref 0–37)
Albumin: 4.3 g/dL (ref 3.5–5.2)
Alkaline Phosphatase: 41 U/L (ref 39–117)
Bilirubin, Direct: 0.1 mg/dL (ref 0.0–0.3)
Total Bilirubin: 0.4 mg/dL (ref 0.2–1.2)
Total Protein: 7.2 g/dL (ref 6.0–8.3)

## 2022-11-07 DIAGNOSIS — G8929 Other chronic pain: Secondary | ICD-10-CM | POA: Diagnosis not present

## 2022-11-07 DIAGNOSIS — M545 Low back pain, unspecified: Secondary | ICD-10-CM | POA: Diagnosis not present

## 2022-11-08 DIAGNOSIS — Z1231 Encounter for screening mammogram for malignant neoplasm of breast: Secondary | ICD-10-CM | POA: Diagnosis not present

## 2022-11-08 LAB — HM MAMMOGRAPHY

## 2022-11-12 DIAGNOSIS — M545 Low back pain, unspecified: Secondary | ICD-10-CM | POA: Diagnosis not present

## 2022-11-12 DIAGNOSIS — R531 Weakness: Secondary | ICD-10-CM | POA: Diagnosis not present

## 2022-11-12 DIAGNOSIS — G8929 Other chronic pain: Secondary | ICD-10-CM | POA: Diagnosis not present

## 2022-11-12 DIAGNOSIS — Z7409 Other reduced mobility: Secondary | ICD-10-CM | POA: Diagnosis not present

## 2022-11-18 DIAGNOSIS — Z7409 Other reduced mobility: Secondary | ICD-10-CM | POA: Diagnosis not present

## 2022-11-18 DIAGNOSIS — M545 Low back pain, unspecified: Secondary | ICD-10-CM | POA: Diagnosis not present

## 2022-11-18 DIAGNOSIS — R531 Weakness: Secondary | ICD-10-CM | POA: Diagnosis not present

## 2022-11-18 DIAGNOSIS — G8929 Other chronic pain: Secondary | ICD-10-CM | POA: Diagnosis not present

## 2022-11-22 ENCOUNTER — Other Ambulatory Visit: Payer: Self-pay | Admitting: Family Medicine

## 2022-11-22 DIAGNOSIS — G8929 Other chronic pain: Secondary | ICD-10-CM

## 2022-11-22 DIAGNOSIS — R531 Weakness: Secondary | ICD-10-CM | POA: Diagnosis not present

## 2022-11-22 DIAGNOSIS — Z7409 Other reduced mobility: Secondary | ICD-10-CM | POA: Diagnosis not present

## 2022-11-22 DIAGNOSIS — M545 Low back pain, unspecified: Secondary | ICD-10-CM | POA: Diagnosis not present

## 2022-11-25 DIAGNOSIS — Z7409 Other reduced mobility: Secondary | ICD-10-CM | POA: Diagnosis not present

## 2022-11-25 DIAGNOSIS — R531 Weakness: Secondary | ICD-10-CM | POA: Diagnosis not present

## 2022-11-25 DIAGNOSIS — M545 Low back pain, unspecified: Secondary | ICD-10-CM | POA: Diagnosis not present

## 2022-11-25 DIAGNOSIS — G8929 Other chronic pain: Secondary | ICD-10-CM | POA: Diagnosis not present

## 2022-11-28 DIAGNOSIS — G8929 Other chronic pain: Secondary | ICD-10-CM | POA: Diagnosis not present

## 2022-11-28 DIAGNOSIS — Z7409 Other reduced mobility: Secondary | ICD-10-CM | POA: Diagnosis not present

## 2022-11-28 DIAGNOSIS — M545 Low back pain, unspecified: Secondary | ICD-10-CM | POA: Diagnosis not present

## 2022-11-28 DIAGNOSIS — R531 Weakness: Secondary | ICD-10-CM | POA: Diagnosis not present

## 2022-12-09 DIAGNOSIS — R531 Weakness: Secondary | ICD-10-CM | POA: Diagnosis not present

## 2022-12-09 DIAGNOSIS — G8929 Other chronic pain: Secondary | ICD-10-CM | POA: Diagnosis not present

## 2022-12-09 DIAGNOSIS — Z7409 Other reduced mobility: Secondary | ICD-10-CM | POA: Diagnosis not present

## 2022-12-09 DIAGNOSIS — M545 Low back pain, unspecified: Secondary | ICD-10-CM | POA: Diagnosis not present

## 2022-12-11 DIAGNOSIS — M545 Low back pain, unspecified: Secondary | ICD-10-CM | POA: Diagnosis not present

## 2022-12-11 DIAGNOSIS — R531 Weakness: Secondary | ICD-10-CM | POA: Diagnosis not present

## 2022-12-11 DIAGNOSIS — Z7409 Other reduced mobility: Secondary | ICD-10-CM | POA: Diagnosis not present

## 2022-12-11 DIAGNOSIS — G8929 Other chronic pain: Secondary | ICD-10-CM | POA: Diagnosis not present

## 2022-12-12 ENCOUNTER — Encounter: Payer: Self-pay | Admitting: Family Medicine

## 2022-12-12 DIAGNOSIS — M545 Other chronic pain: Secondary | ICD-10-CM

## 2022-12-12 NOTE — Telephone Encounter (Signed)
No just seen it , will refer

## 2022-12-13 ENCOUNTER — Encounter: Payer: Self-pay | Admitting: Physical Medicine and Rehabilitation

## 2022-12-23 ENCOUNTER — Other Ambulatory Visit: Payer: Self-pay | Admitting: Family Medicine

## 2022-12-23 DIAGNOSIS — M545 Low back pain, unspecified: Secondary | ICD-10-CM

## 2022-12-26 DIAGNOSIS — G8929 Other chronic pain: Secondary | ICD-10-CM | POA: Diagnosis not present

## 2022-12-26 DIAGNOSIS — M545 Low back pain, unspecified: Secondary | ICD-10-CM | POA: Diagnosis not present

## 2022-12-26 DIAGNOSIS — M549 Dorsalgia, unspecified: Secondary | ICD-10-CM | POA: Diagnosis not present

## 2022-12-28 DIAGNOSIS — M5136 Other intervertebral disc degeneration, lumbar region: Secondary | ICD-10-CM | POA: Diagnosis not present

## 2022-12-28 DIAGNOSIS — M5126 Other intervertebral disc displacement, lumbar region: Secondary | ICD-10-CM | POA: Diagnosis not present

## 2022-12-28 DIAGNOSIS — M5137 Other intervertebral disc degeneration, lumbosacral region: Secondary | ICD-10-CM | POA: Diagnosis not present

## 2022-12-28 DIAGNOSIS — M47816 Spondylosis without myelopathy or radiculopathy, lumbar region: Secondary | ICD-10-CM | POA: Diagnosis not present

## 2022-12-28 DIAGNOSIS — M48061 Spinal stenosis, lumbar region without neurogenic claudication: Secondary | ICD-10-CM | POA: Diagnosis not present

## 2023-01-13 ENCOUNTER — Encounter: Payer: PPO | Admitting: Physical Medicine and Rehabilitation

## 2023-01-14 NOTE — Addendum Note (Signed)
Addended by: Scharlene Gloss B on: 01/14/2023 10:59 AM   Modules accepted: Orders

## 2023-01-15 ENCOUNTER — Encounter: Payer: Self-pay | Admitting: Family Medicine

## 2023-01-15 ENCOUNTER — Telehealth (INDEPENDENT_AMBULATORY_CARE_PROVIDER_SITE_OTHER): Payer: PPO | Admitting: Family Medicine

## 2023-01-15 DIAGNOSIS — R251 Tremor, unspecified: Secondary | ICD-10-CM

## 2023-01-15 MED ORDER — PRIMIDONE 50 MG PO TABS
ORAL_TABLET | ORAL | 1 refills | Status: DC
Start: 2023-01-15 — End: 2023-02-10

## 2023-01-15 NOTE — Progress Notes (Signed)
Chief Complaint  Patient presents with   Tremors    Subjective: Patient is a 77 y.o. female here for f/u tremor. We are interacting via web portal for an electronic face-to-face visit. I verified patient's ID using 2 identifiers. Patient agreed to proceed with visit via this method. Patient is at home, I am at office. Patient and I are present for visit.   Pt w a hx of essential tremor. Was on propranolol before and thought it worked. Lately, has not felt it has been very effective. She reports compliance, no AE's. Usually experiences it when she is moving, not at rest. Interested in seeing the neurology team.   Past Medical History:  Diagnosis Date   Barrett's esophagus    Hyperlipidemia     Objective: No conversational dyspnea Age appropriate judgment and insight Nml affect and mood  Assessment and Plan: Tremor - Plan: primidone (MYSOLINE) 50 MG tablet, Ambulatory referral to Neurology  Chronic, uncontrolled.  Stop propranolol, start primidone 25 mg daily for 1 week.  Increase to 50 mg daily after that.  Refer to neurology.  It would likely take months to get in so we will have her follow-up in 1 month to recheck.  Could increase dosage or add back propranolol. The patient voiced understanding and agreement to the plan.  Jilda Roche [place], DO 01/15/23  2:24 PM

## 2023-01-27 ENCOUNTER — Other Ambulatory Visit: Payer: Self-pay | Admitting: Family Medicine

## 2023-02-01 ENCOUNTER — Other Ambulatory Visit: Payer: Self-pay | Admitting: Family Medicine

## 2023-02-05 ENCOUNTER — Other Ambulatory Visit: Payer: Self-pay | Admitting: Gastroenterology

## 2023-02-06 ENCOUNTER — Encounter: Payer: Self-pay | Admitting: Family Medicine

## 2023-02-07 ENCOUNTER — Telehealth: Payer: Self-pay | Admitting: Gastroenterology

## 2023-02-07 MED ORDER — OMEPRAZOLE 20 MG PO CPDR: 20.0000 mg | DELAYED_RELEASE_CAPSULE | Freq: Every day | ORAL | 0 refills | Status: AC

## 2023-02-07 NOTE — Telephone Encounter (Signed)
Inbound call from patient stating she needs a refill for omeprazole. Patient stated she saw another GI provider through [facility]. Patient states they were unable to refill medication. Patient requesting a call to discuss refill. Please advise, thank you.

## 2023-02-07 NOTE — Telephone Encounter (Signed)
Patient made aware that her medication would be sent in but had to make an appointment. Scheduled for 05-01-2023 at 210 and she needs to keep this appointment. Says she does omeprazole 20 daily so that is what I sent in

## 2023-02-08 ENCOUNTER — Other Ambulatory Visit: Payer: Self-pay | Admitting: Family Medicine

## 2023-02-08 DIAGNOSIS — R251 Tremor, unspecified: Secondary | ICD-10-CM

## 2023-02-19 DIAGNOSIS — K2270 Barrett's esophagus without dysplasia: Secondary | ICD-10-CM | POA: Diagnosis not present

## 2023-02-19 DIAGNOSIS — K219 Gastro-esophageal reflux disease without esophagitis: Secondary | ICD-10-CM | POA: Diagnosis not present

## 2023-02-25 DIAGNOSIS — M5416 Radiculopathy, lumbar region: Secondary | ICD-10-CM | POA: Diagnosis not present

## 2023-02-25 DIAGNOSIS — G8929 Other chronic pain: Secondary | ICD-10-CM | POA: Diagnosis not present

## 2023-03-04 DIAGNOSIS — D1801 Hemangioma of skin and subcutaneous tissue: Secondary | ICD-10-CM | POA: Diagnosis not present

## 2023-03-04 DIAGNOSIS — L821 Other seborrheic keratosis: Secondary | ICD-10-CM | POA: Diagnosis not present

## 2023-03-04 DIAGNOSIS — L578 Other skin changes due to chronic exposure to nonionizing radiation: Secondary | ICD-10-CM | POA: Diagnosis not present

## 2023-03-04 DIAGNOSIS — D692 Other nonthrombocytopenic purpura: Secondary | ICD-10-CM | POA: Diagnosis not present

## 2023-03-04 DIAGNOSIS — D235 Other benign neoplasm of skin of trunk: Secondary | ICD-10-CM | POA: Diagnosis not present

## 2023-03-04 DIAGNOSIS — L570 Actinic keratosis: Secondary | ICD-10-CM | POA: Diagnosis not present

## 2023-03-04 DIAGNOSIS — Z85828 Personal history of other malignant neoplasm of skin: Secondary | ICD-10-CM | POA: Diagnosis not present

## 2023-03-04 DIAGNOSIS — L814 Other melanin hyperpigmentation: Secondary | ICD-10-CM | POA: Diagnosis not present

## 2023-03-19 DIAGNOSIS — G8929 Other chronic pain: Secondary | ICD-10-CM | POA: Diagnosis not present

## 2023-03-19 DIAGNOSIS — M5416 Radiculopathy, lumbar region: Secondary | ICD-10-CM | POA: Diagnosis not present

## 2023-03-24 DIAGNOSIS — G20A1 Parkinson's disease without dyskinesia, without mention of fluctuations: Secondary | ICD-10-CM | POA: Diagnosis not present

## 2023-03-25 ENCOUNTER — Encounter: Payer: Self-pay | Admitting: Family Medicine

## 2023-03-26 DIAGNOSIS — M51369 Other intervertebral disc degeneration, lumbar region without mention of lumbar back pain or lower extremity pain: Secondary | ICD-10-CM | POA: Diagnosis not present

## 2023-03-26 DIAGNOSIS — M47816 Spondylosis without myelopathy or radiculopathy, lumbar region: Secondary | ICD-10-CM | POA: Diagnosis not present

## 2023-03-26 DIAGNOSIS — G894 Chronic pain syndrome: Secondary | ICD-10-CM | POA: Diagnosis not present

## 2023-03-26 DIAGNOSIS — M5416 Radiculopathy, lumbar region: Secondary | ICD-10-CM | POA: Diagnosis not present

## 2023-03-31 ENCOUNTER — Encounter: Payer: Self-pay | Admitting: Gastroenterology

## 2023-04-02 DIAGNOSIS — D3132 Benign neoplasm of left choroid: Secondary | ICD-10-CM | POA: Diagnosis not present

## 2023-04-04 ENCOUNTER — Other Ambulatory Visit: Payer: Self-pay | Admitting: Family Medicine

## 2023-04-04 DIAGNOSIS — B351 Tinea unguium: Secondary | ICD-10-CM

## 2023-04-04 NOTE — Telephone Encounter (Signed)
I don't mind refilling this, but can we make sure she needs it for her nail fungus? Ty.

## 2023-04-08 NOTE — Telephone Encounter (Signed)
Patient seen another GI doctor and said that she is following up with them and not with [place] and said I could cancel her appointment and I did

## 2023-04-09 DIAGNOSIS — K2100 Gastro-esophageal reflux disease with esophagitis, without bleeding: Secondary | ICD-10-CM | POA: Diagnosis not present

## 2023-04-09 DIAGNOSIS — K259 Gastric ulcer, unspecified as acute or chronic, without hemorrhage or perforation: Secondary | ICD-10-CM | POA: Diagnosis not present

## 2023-04-09 DIAGNOSIS — K449 Diaphragmatic hernia without obstruction or gangrene: Secondary | ICD-10-CM | POA: Diagnosis not present

## 2023-04-09 DIAGNOSIS — K2270 Barrett's esophagus without dysplasia: Secondary | ICD-10-CM | POA: Diagnosis not present

## 2023-04-09 DIAGNOSIS — K2289 Other specified disease of esophagus: Secondary | ICD-10-CM | POA: Diagnosis not present

## 2023-04-09 DIAGNOSIS — Z8719 Personal history of other diseases of the digestive system: Secondary | ICD-10-CM | POA: Diagnosis not present

## 2023-04-09 DIAGNOSIS — K219 Gastro-esophageal reflux disease without esophagitis: Secondary | ICD-10-CM | POA: Diagnosis not present

## 2023-04-22 DIAGNOSIS — G8929 Other chronic pain: Secondary | ICD-10-CM | POA: Diagnosis not present

## 2023-04-22 DIAGNOSIS — M5416 Radiculopathy, lumbar region: Secondary | ICD-10-CM | POA: Diagnosis not present

## 2023-04-27 ENCOUNTER — Other Ambulatory Visit: Payer: Self-pay | Admitting: Family Medicine

## 2023-04-27 ENCOUNTER — Other Ambulatory Visit: Payer: Self-pay | Admitting: Gastroenterology

## 2023-04-28 ENCOUNTER — Other Ambulatory Visit: Payer: Self-pay | Admitting: Family Medicine

## 2023-05-01 ENCOUNTER — Ambulatory Visit: Payer: PPO | Admitting: Gastroenterology

## 2023-05-12 ENCOUNTER — Encounter: Payer: Self-pay | Admitting: Family Medicine

## 2023-05-12 ENCOUNTER — Other Ambulatory Visit: Payer: Self-pay | Admitting: Family Medicine

## 2023-05-12 MED ORDER — ROSUVASTATIN CALCIUM 20 MG PO TABS: 20.0000 mg | ORAL_TABLET | Freq: Every day | ORAL | 3 refills | Status: DC

## 2023-05-20 DIAGNOSIS — M47816 Spondylosis without myelopathy or radiculopathy, lumbar region: Secondary | ICD-10-CM | POA: Diagnosis not present

## 2023-05-20 DIAGNOSIS — M5416 Radiculopathy, lumbar region: Secondary | ICD-10-CM | POA: Diagnosis not present

## 2023-05-20 DIAGNOSIS — M51369 Other intervertebral disc degeneration, lumbar region without mention of lumbar back pain or lower extremity pain: Secondary | ICD-10-CM | POA: Diagnosis not present

## 2023-05-20 DIAGNOSIS — G894 Chronic pain syndrome: Secondary | ICD-10-CM | POA: Diagnosis not present

## 2023-06-20 ENCOUNTER — Other Ambulatory Visit: Payer: Self-pay | Admitting: Family Medicine

## 2023-06-20 DIAGNOSIS — R251 Tremor, unspecified: Secondary | ICD-10-CM

## 2023-06-23 DIAGNOSIS — G20A1 Parkinson's disease without dyskinesia, without mention of fluctuations: Secondary | ICD-10-CM | POA: Diagnosis not present

## 2023-06-30 ENCOUNTER — Ambulatory Visit: Payer: PPO

## 2023-07-02 ENCOUNTER — Ambulatory Visit (INDEPENDENT_AMBULATORY_CARE_PROVIDER_SITE_OTHER): Payer: PPO | Admitting: *Deleted

## 2023-07-02 DIAGNOSIS — Z0000 Encounter for general adult medical examination without abnormal findings: Secondary | ICD-10-CM

## 2023-07-02 NOTE — Patient Instructions (Signed)
Vickie Lopez , Thank you for taking time to come for your Medicare Wellness Visit. I appreciate your ongoing commitment to your health goals. Please review the following plan we discussed and let me know if I can assist you in the future.     This is a list of the screening recommended for you and due dates:  Health Maintenance  Topic Date Due   DTaP/Tdap/Td vaccine (2 - Tdap) 10/30/2013   Medicare Annual Wellness Visit  07/01/2024   Pneumonia Vaccine  Completed   Flu Shot  Completed   DEXA scan (bone density measurement)  Completed   Hepatitis C Screening  Completed   Zoster (Shingles) Vaccine  Completed   HPV Vaccine  Aged Out   Colon Cancer Screening  Discontinued   COVID-19 Vaccine  Discontinued    Next appointment: Follow up in one year for your annual wellness visit.   Preventive Care 79 Years and Older, Female Preventive care refers to lifestyle choices and visits with your health care provider that can promote health and wellness. What does preventive care include? A yearly physical exam. This is also called an annual well check. Dental exams once or twice a year. Routine eye exams. Ask your health care provider how often you should have your eyes checked. Personal lifestyle choices, including: Daily care of your teeth and gums. Regular physical activity. Eating a healthy diet. Avoiding tobacco and drug use. Limiting alcohol use. Practicing safe sex. Taking low-dose aspirin every day. Taking vitamin and mineral supplements as recommended by your health care provider. What happens during an annual well check? The services and screenings done by your health care provider during your annual well check will depend on your age, overall health, lifestyle risk factors, and family history of disease. Counseling  Your health care provider may ask you questions about your: Alcohol use. Tobacco use. Drug use. Emotional well-being. Home and relationship well-being. Sexual  activity. Eating habits. History of falls. Memory and ability to understand (cognition). Work and work Astronomer. Reproductive health. Screening  You may have the following tests or measurements: Height, weight, and BMI. Blood pressure. Lipid and cholesterol levels. These may be checked every 5 years, or more frequently if you are over 68 years old. Skin check. Lung cancer screening. You may have this screening every year starting at age 65 if you have a 30-pack-year history of smoking and currently smoke or have quit within the past 15 years. Fecal occult blood test (FOBT) of the stool. You may have this test every year starting at age 79. Flexible sigmoidoscopy or colonoscopy. You may have a sigmoidoscopy every 5 years or a colonoscopy every 10 years starting at age 20. Hepatitis C blood test. Hepatitis B blood test. Sexually transmitted disease (STD) testing. Diabetes screening. This is done by checking your blood sugar (glucose) after you have not eaten for a while (fasting). You may have this done every 1-3 years. Bone density scan. This is done to screen for osteoporosis. You may have this done starting at age 28. Mammogram. This may be done every 1-2 years. Talk to your health care provider about how often you should have regular mammograms. Talk with your health care provider about your test results, treatment options, and if necessary, the need for more tests. Vaccines  Your health care provider may recommend certain vaccines, such as: Influenza vaccine. This is recommended every year. Tetanus, diphtheria, and acellular pertussis (Tdap, Td) vaccine. You may need a Td booster every 10 years. Zoster  vaccine. You may need this after age 30. Pneumococcal 13-valent conjugate (PCV13) vaccine. One dose is recommended after age 3. Pneumococcal polysaccharide (PPSV23) vaccine. One dose is recommended after age 31. Talk to your health care provider about which screenings and vaccines  you need and how often you need them. This information is not intended to replace advice given to you by your health care provider. Make sure you discuss any questions you have with your health care provider. Document Released: 06/02/2015 Document Revised: 01/24/2016 Document Reviewed: 03/07/2015 Elsevier Interactive Patient Education  2017 [employer].  Fall Prevention in the Home Falls can cause injuries. They can happen to people of all ages. There are many things you can do to make your home safe and to help prevent falls. What can I do on the outside of my home? Regularly fix the edges of walkways and driveways and fix any cracks. Remove anything that might make you trip as you walk through a door, such as a raised step or threshold. Trim any bushes or trees on the path to your home. Use bright outdoor lighting. Clear any walking paths of anything that might make someone trip, such as rocks or tools. Regularly check to see if handrails are loose or broken. Make sure that both sides of any steps have handrails. Any raised decks and porches should have guardrails on the edges. Have any leaves, snow, or ice cleared regularly. Use sand or salt on walking paths during winter. Clean up any spills in your garage right away. This includes oil or grease spills. What can I do in the bathroom? Use night lights. Install grab bars by the toilet and in the tub and shower. Do not use towel bars as grab bars. Use non-skid mats or decals in the tub or shower. If you need to sit down in the shower, use a plastic, non-slip stool. Keep the floor dry. Clean up any water that spills on the floor as soon as it happens. Remove soap buildup in the tub or shower regularly. Attach bath mats securely with double-sided non-slip rug tape. Do not have throw rugs and other things on the floor that can make you trip. What can I do in the bedroom? Use night lights. Make sure that you have a light by your bed that  is easy to reach. Do not use any sheets or blankets that are too big for your bed. They should not hang down onto the floor. Have a firm chair that has side arms. You can use this for support while you get dressed. Do not have throw rugs and other things on the floor that can make you trip. What can I do in the kitchen? Clean up any spills right away. Avoid walking on wet floors. Keep items that you use a lot in easy-to-reach places. If you need to reach something above you, use a strong step stool that has a grab bar. Keep electrical cords out of the way. Do not use floor polish or wax that makes floors slippery. If you must use wax, use non-skid floor wax. Do not have throw rugs and other things on the floor that can make you trip. What can I do with my stairs? Do not leave any items on the stairs. Make sure that there are handrails on both sides of the stairs and use them. Fix handrails that are broken or loose. Make sure that handrails are as long as the stairways. Check any carpeting to make sure that it  is firmly attached to the stairs. Fix any carpet that is loose or worn. Avoid having throw rugs at the top or bottom of the stairs. If you do have throw rugs, attach them to the floor with carpet tape. Make sure that you have a light switch at the top of the stairs and the bottom of the stairs. If you do not have them, ask someone to add them for you. What else can I do to help prevent falls? Wear shoes that: Do not have high heels. Have rubber bottoms. Are comfortable and fit you well. Are closed at the toe. Do not wear sandals. If you use a stepladder: Make sure that it is fully opened. Do not climb a closed stepladder. Make sure that both sides of the stepladder are locked into place. Ask someone to hold it for you, if possible. Clearly mark and make sure that you can see: Any grab bars or handrails. First and last steps. Where the edge of each step is. Use tools that help you  move around (mobility aids) if they are needed. These include: Canes. Walkers. Scooters. Crutches. Turn on the lights when you go into a dark area. Replace any light bulbs as soon as they burn out. Set up your furniture so you have a clear path. Avoid moving your furniture around. If any of your floors are uneven, fix them. If there are any pets around you, be aware of where they are. Review your medicines with your doctor. Some medicines can make you feel dizzy. This can increase your chance of falling. Ask your doctor what other things that you can do to help prevent falls. This information is not intended to replace advice given to you by your health care provider. Make sure you discuss any questions you have with your health care provider. Document Released: 03/02/2009 Document Revised: 10/12/2015 Document Reviewed: 06/10/2014 Elsevier Interactive Patient Education  2017 [employer].

## 2023-07-02 NOTE — Progress Notes (Signed)
Subjective:   Vickie Lopez is a 78 y.o. female who presents for Medicare Annual (Subsequent) preventive examination.  Visit Complete: Virtual I connected with  Vickie Lopez on 07/02/23 by a audio enabled telemedicine application and verified that I am speaking with the correct person using two identifiers.  Patient Location: Home  Provider Location: Office/Clinic  I discussed the limitations of evaluation and management by telemedicine. The patient expressed understanding and agreed to proceed.  Vital Signs: Because this visit was a virtual/telehealth visit, some criteria may be missing or patient reported. Any vitals not documented were not able to be obtained and vitals that have been documented are patient reported.  Cardiac Risk Factors include: advanced age (>86men, >78 women);dyslipidemia     Objective:    There were no vitals filed for this visit. There is no height or weight on file to calculate BMI.     07/02/2023    1:04 PM 05/02/2022   10:26 AM 11/28/2021    2:50 PM 04/09/2021   11:45 AM  Advanced Directives  Does Patient Have a Medical Advance Directive? Yes Yes Yes Yes  Type of Estate agent of [place];Living will Healthcare Power of [place];Living will Healthcare Power of [place];Living will Healthcare Power of [place];Living will  Does patient want to make changes to medical advance directive? No - Patient declined No - Patient declined No - Patient declined   Copy of Healthcare Power of Attorney in Chart? Yes - validated most recent copy scanned in chart (See row information) No - copy requested No - copy requested No - copy requested    Current Medications (verified) Outpatient Encounter Medications as of 07/02/2023  Medication Sig   carbidopa-levodopa (SINEMET IR) 25-100 MG tablet One p.o. TID with meals   estradiol (ESTRACE) 2 MG tablet Take 2 mg by mouth daily.   medroxyPROGESTERone (PROVERA) 2.5 MG tablet Take 2.5 mg by mouth  daily.   omeprazole (PRILOSEC) 20 MG capsule Take 1 capsule (20 mg total) by mouth daily. Please call [phone] to schedule an office visit for more refills   primidone (MYSOLINE) 50 MG tablet TAKE 1/2 TAB DAILY FOR 1 WEEK AND THEN TAKE 1 TAB DAILY.   propranolol ER (INDERAL LA) 60 MG 24 hr capsule Take 60 mg by mouth daily.   rosuvastatin (CRESTOR) 20 MG tablet Take 1 tablet (20 mg total) by mouth daily.   terbinafine (LAMISIL) 250 MG tablet TAKE 1 TABLET BY MOUTH EVERY DAY   No facility-administered encounter medications on file as of 07/02/2023.    Allergies (verified) Patient has no known allergies.   History: Past Medical History:  Diagnosis Date   Barrett's esophagus    Hyperlipidemia    Past Surgical History:  Procedure Laterality Date   COLONOSCOPY WITH ESOPHAGOGASTRODUODENOSCOPY (EGD)  02/07/2016   [facility]   TOTAL HIP ARTHROPLASTY Left 2000   Family History  Problem Relation Age of Onset   Heart attack Mother    Heart attack Father    Cancer Sister        esophageal cancer   Colon cancer Neg Hx    Social History   Socioeconomic History   Marital status: Single    Spouse name: Not on file   Number of children: Not on file   Years of education: Not on file   Highest education level: 12th grade  Occupational History   Not on file  Tobacco Use   Smoking status: Former   Smokeless tobacco: Never  Vaping Use   Vaping status: Never Used  Substance and Sexual Activity   Alcohol use: Yes    Comment: ocassionally   Drug use: Never   Sexual activity: Not Currently  Other Topics Concern   Not on file  Social History Narrative   Not on file   Social Drivers of Health   Financial Resource Strain: Low Risk  (07/02/2023)   Overall Financial Resource Strain (CARDIA)    Difficulty of Paying Living Expenses: Not hard at all  Food Insecurity: No Food Insecurity (07/02/2023)   Hunger Vital Sign    Worried About Running Out of Food in the Last Year: Never  true    Ran Out of Food in the Last Year: Never true  Transportation Needs: No Transportation Needs (07/02/2023)   PRAPARE - Administrator, Civil Service (Medical): No    Lack of Transportation (Non-Medical): No  Physical Activity: Inactive (07/02/2023)   Exercise Vital Sign    Days of Exercise per Week: 0 days    Minutes of Exercise per Session: 0 min  Stress: No Stress Concern Present (07/02/2023)   [employer] of Occupational Health - Occupational Stress Questionnaire    Feeling of Stress : Not at all  Social Connections: Moderately Isolated (07/02/2023)   Social Connection and Isolation Panel [NHANES]    Frequency of Communication with Friends and Family: More than three times a week    Frequency of Social Gatherings with Friends and Family: Twice a week    Attends Religious Services: More than 4 times per year    Active Member of [employer] or Organizations: No    Attends Engineer, structural: Never    Marital Status: Divorced    Tobacco Counseling Counseling given: Not Answered   Clinical Intake:  Pre-visit preparation completed: Yes  Pain : No/denies pain  Nutritional Risks: None Diabetes: No  How often do you need to have someone help you when you read instructions, pamphlets, or other written materials from your doctor or pharmacy?: 1 - Never  Interpreter Needed?: No  Information entered by :: [employer], CMA   Activities of Daily Living    07/02/2023    1:10 PM  In your present state of health, do you have any difficulty performing the following activities:  Hearing? 1  Comment wears hearing aids  Vision? 0  Difficulty concentrating or making decisions? 0  Walking or climbing stairs? 0  Dressing or bathing? 0  Doing errands, shopping? 0  Preparing Food and eating ? N  Using the Toilet? N  In the past six months, have you accidently leaked urine? Y  Do you have problems with loss of bowel control? N  Managing your Medications? N   Managing your Finances? N  Housekeeping or managing your Housekeeping? N    Patient Care Team: Sharlene Dory, DO as PCP - General (Family Medicine)  Indicate any recent Medical Services you may have received from other than [facility] providers in the past year (date may be approximate).     Assessment:   This is a routine wellness examination for Y[place]ania.  Hearing/Vision screen No results found.   Goals Addressed   None    Depression Screen    07/02/2023    1:24 PM 09/18/2022    8:47 AM 05/02/2022   10:25 AM 04/09/2021   11:48 AM 10/18/2020   12:47 PM 02/23/2020   10:54 AM  PHQ 2/9 Scores  PHQ - 2 Score 0 0 0  0 0 0  PHQ- 9 Score  2        Fall Risk    07/02/2023    1:14 PM 09/18/2022    8:47 AM 05/02/2022   10:02 AM 04/09/2021   11:46 AM 10/18/2020   12:46 PM  Fall Risk   Falls in the past year? 0 0 0 0 0  Number falls in past yr: 0 0 0 0 0  Injury with Fall? 0 0 0 0 0  Risk for fall due to : No Fall Risks No Fall Risks   No Fall Risks  Follow up Falls evaluation completed Falls evaluation completed  Falls prevention discussed Falls evaluation completed    MEDICARE RISK AT HOME: Medicare Risk at Home Any stairs in or around the home?: Yes If so, are there any without handrails?: Yes Home free of loose throw rugs in walkways, pet beds, electrical cords, etc?: Yes Adequate lighting in your home to reduce risk of falls?: Yes Life alert?: No Use of a cane, walker or w/c?: No Grab bars in the bathroom?: No Shower chair or bench in shower?: Yes Elevated toilet seat or a handicapped toilet?: No  TIMED UP AND GO:  Was the test performed?  No    Cognitive Function:        07/02/2023    1:25 PM 05/02/2022   10:30 AM  6CIT Screen  What Year? 0 points 0 points  What month? 0 points 0 points  What time? 0 points 0 points  Count back from 20 0 points 0 points  Months in reverse 0 points 0 points  Repeat phrase 0 points 0 points  Total Score 0 points 0  points    Immunizations Immunization History  Administered Date(s) Administered   Influenza, High Dose Seasonal PF 03/06/2018, 02/16/2019, 03/01/2022   Influenza-Unspecified 06/18/2023   PFIZER(Purple Top)SARS-COV-2 Vaccination 07/13/2019, 08/03/2019, 04/11/2020, 03/01/2022   Pneumococcal Conjugate-13 05/27/2014, 10/27/2015   Pneumococcal Polysaccharide-23 05/03/2016   Td 10/31/2003   Zoster Recombinant(Shingrix) 03/22/2019, 06/02/2019    TDAP status: Due, Education has been provided regarding the importance of this vaccine. Advised may receive this vaccine at local pharmacy or Health Dept. Aware to provide a copy of the vaccination record if obtained from local pharmacy or Health Dept. Verbalized acceptance and understanding.  Flu Vaccine status: Up to date  Pneumococcal vaccine status: Up to date  Covid-19 vaccine status: Information provided on how to obtain vaccines.   Qualifies for Shingles Vaccine? Yes   Zostavax completed No   Shingrix Completed?: Yes  Screening Tests Health Maintenance  Topic Date Due   DTaP/Tdap/Td (2 - Tdap) 10/30/2013   Medicare Annual Wellness (AWV)  05/03/2023   Pneumonia Vaccine 15+ Years old  Completed   INFLUENZA VACCINE  Completed   DEXA SCAN  Completed   Hepatitis C Screening  Completed   Zoster Vaccines- Shingrix  Completed   HPV VACCINES  Aged Out   Colonoscopy  Discontinued   COVID-19 Vaccine  Discontinued    Health Maintenance  Health Maintenance Due  Topic Date Due   DTaP/Tdap/Td (2 - Tdap) 10/30/2013   Medicare Annual Wellness (AWV)  05/03/2023    Colorectal cancer screening: No longer required.   Mammogram status: Completed 11/08/22. Repeat every year  Bone Density status: Completed 04/19/21. Results reflect: Bone density results: NORMAL. Repeat every 2 years.  Lung Cancer Screening: (Low Dose CT Chest recommended if Age 11-80 years, 20 pack-year currently smoking OR have quit w/in 15years.) does not qualify.  Additional Screening:  Hepatitis C Screening: does qualify; Completed 04/19/21  Vision Screening: Recommended annual ophthalmology exams for early detection of glaucoma and other disorders of the eye. Is the patient up to date with their annual eye exam?  Yes  Who is the provider or what is the name of the office in which the patient attends annual eye exams? [facility]. If pt is not established with a provider, would they like to be referred to a provider to establish care? No .   Dental Screening: Recommended annual dental exams for proper oral hygiene  Diabetic Foot Exam: N/a  Community Resource Referral / Chronic Care Management: CRR required this visit?  No   CCM required this visit?  No     Plan:     I have personally reviewed and noted the following in the patient's chart:   Medical and social history Use of alcohol, tobacco or illicit drugs  Current medications and supplements including opioid prescriptions. Patient is not currently taking opioid prescriptions. Functional ability and status Nutritional status Physical activity Advanced directives List of other physicians Hospitalizations, surgeries, and ER visits in previous 12 months Vitals Screenings to include cognitive, depression, and falls Referrals and appointments  In addition, I have reviewed and discussed with patient certain preventive protocols, quality metrics, and best practice recommendations. A written personalized care plan for preventive services as well as general preventive health recommendations were provided to patient.     Donne Anon, CMA   07/02/2023   After Visit Summary: (MyChart) Due to this being a telephonic visit, the after visit summary with patients personalized plan was offered to patient via MyChart   Nurse Notes: None

## 2023-07-03 ENCOUNTER — Encounter: Payer: Self-pay | Admitting: Family Medicine

## 2023-07-03 ENCOUNTER — Other Ambulatory Visit: Payer: Self-pay | Admitting: Family Medicine

## 2023-07-21 DIAGNOSIS — K2270 Barrett's esophagus without dysplasia: Secondary | ICD-10-CM | POA: Diagnosis not present

## 2023-07-21 DIAGNOSIS — K259 Gastric ulcer, unspecified as acute or chronic, without hemorrhage or perforation: Secondary | ICD-10-CM | POA: Diagnosis not present

## 2023-07-21 DIAGNOSIS — K3189 Other diseases of stomach and duodenum: Secondary | ICD-10-CM | POA: Diagnosis not present

## 2023-07-21 DIAGNOSIS — Z8711 Personal history of peptic ulcer disease: Secondary | ICD-10-CM | POA: Diagnosis not present

## 2023-07-21 DIAGNOSIS — K449 Diaphragmatic hernia without obstruction or gangrene: Secondary | ICD-10-CM | POA: Diagnosis not present

## 2023-07-21 DIAGNOSIS — K2950 Unspecified chronic gastritis without bleeding: Secondary | ICD-10-CM | POA: Diagnosis not present

## 2023-07-25 ENCOUNTER — Encounter: Payer: Self-pay | Admitting: Family Medicine

## 2023-07-25 ENCOUNTER — Ambulatory Visit (INDEPENDENT_AMBULATORY_CARE_PROVIDER_SITE_OTHER): Admitting: Family Medicine

## 2023-07-25 VITALS — BP 114/66 | HR 63 | Temp 97.5°F | Resp 20 | Ht 62.0 in | Wt 137.0 lb

## 2023-07-25 DIAGNOSIS — R251 Tremor, unspecified: Secondary | ICD-10-CM

## 2023-07-25 DIAGNOSIS — E785 Hyperlipidemia, unspecified: Secondary | ICD-10-CM | POA: Diagnosis not present

## 2023-07-25 MED ORDER — PROPRANOLOL HCL ER 60 MG PO CP24: 60.0000 mg | ORAL_CAPSULE | Freq: Every day | ORAL | Status: AC

## 2023-07-25 NOTE — Patient Instructions (Signed)
 Give [place] 2-3 business days to get the results of your labs back.   Keep the diet clean and stay active.  Please consider adding some weight resistance exercise to your routine. Consider yoga as well.   Consider getting your tetanus booster at your pharmacy.  Let us know if you need anything.

## 2023-07-25 NOTE — Progress Notes (Signed)
 Chief Complaint  Patient presents with   Medical Management of Chronic Issues    Patient presents today for a 6 month follow-up    Subjective: Hyperlipidemia Patient presents for Hyperlipidemia follow up. Currently taking Crestor 20 mg/d and compliance with treatment thus far has been good. She denies myalgias. She is usually adhering to a healthy diet. Exercise: some walking No CP or SOB.  The patient is not known to have coexisting coronary artery disease.  Tremor- Hx of a tremor. Currently on Sinemet TID with the neurology team. Was off of propranolol but thinks she does better with it. Wondering if she should go back on it.   Past Medical History:  Diagnosis Date   Barrett's esophagus    Hyperlipidemia     Objective: BP 114/66   Pulse 63   Temp (!) 97.5 F (36.4 C)   Resp 20   Ht 5\' 2"  (1.575 m)   Wt 137 lb (62.1 kg)   SpO2 95%   BMI 25.06 kg/m  General: Awake, appears stated age HEENT: MMM Heart: RRR, no LE edema, no bruits Lungs: CTAB, no rales, wheezes or rhonchi. No accessory muscle use Neuro: Gait normal, no resting tremor noted Psych: Age appropriate judgment and insight, normal affect and mood  Assessment and Plan: Hyperlipidemia, unspecified hyperlipidemia type  Tremor  Chronic, stable.  Continue Crestor 20 mg daily.  Check labs today.  Counseled on diet and exercise. Chronic, stable.  Continue Sinemet as prescribed by the neurology team.  Add back propranolol LA 60 mg daily.  Stop primidone as this was not helpful. Recommended to get a tetanus booster at the pharmacy. Follow-up in 6 months for physical or as needed. The patient voiced understanding and agreement to the plan.  Jilda Roche Kennard, DO 07/25/23  2:21 PM

## 2023-07-26 ENCOUNTER — Encounter: Payer: Self-pay | Admitting: Family Medicine

## 2023-07-26 LAB — COMPREHENSIVE METABOLIC PANEL
AG Ratio: 1.9 (calc) (ref 1.0–2.5)
ALT: 3 U/L — ABNORMAL LOW (ref 6–29)
AST: 13 U/L (ref 10–35)
Albumin: 4.3 g/dL (ref 3.6–5.1)
Alkaline phosphatase (APISO): 53 U/L (ref 37–153)
BUN: 17 mg/dL (ref 7–25)
CO2: 26 mmol/L (ref 20–32)
Calcium: 9.7 mg/dL (ref 8.6–10.4)
Chloride: 105 mmol/L (ref 98–110)
Creat: 0.92 mg/dL (ref 0.60–1.00)
Globulin: 2.3 g/dL (ref 1.9–3.7)
Glucose, Bld: 86 mg/dL (ref 65–99)
Potassium: 4.3 mmol/L (ref 3.5–5.3)
Sodium: 139 mmol/L (ref 135–146)
Total Bilirubin: 0.3 mg/dL (ref 0.2–1.2)
Total Protein: 6.6 g/dL (ref 6.1–8.1)

## 2023-07-26 LAB — LIPID PANEL
Cholesterol: 179 mg/dL (ref <200)
HDL: 79 mg/dL (ref >=50)
LDL Cholesterol (Calc): 81 mg/dL
Non-HDL Cholesterol (Calc): 100 mg/dL (ref <130)
Total CHOL/HDL Ratio: 2.3 (calc) (ref <5.0)
Triglycerides: 91 mg/dL (ref <150)

## 2023-08-23 ENCOUNTER — Other Ambulatory Visit: Payer: Self-pay | Admitting: Family Medicine

## 2023-08-23 DIAGNOSIS — B351 Tinea unguium: Secondary | ICD-10-CM

## 2023-10-22 DIAGNOSIS — M47816 Spondylosis without myelopathy or radiculopathy, lumbar region: Secondary | ICD-10-CM | POA: Diagnosis not present

## 2023-10-22 DIAGNOSIS — G894 Chronic pain syndrome: Secondary | ICD-10-CM | POA: Diagnosis not present

## 2023-11-01 ENCOUNTER — Other Ambulatory Visit: Payer: Self-pay | Admitting: Family Medicine

## 2023-11-01 DIAGNOSIS — R251 Tremor, unspecified: Secondary | ICD-10-CM

## 2023-12-03 DIAGNOSIS — X58XXXA Exposure to other specified factors, initial encounter: Secondary | ICD-10-CM | POA: Diagnosis not present

## 2023-12-03 DIAGNOSIS — R609 Edema, unspecified: Secondary | ICD-10-CM | POA: Diagnosis not present

## 2023-12-03 DIAGNOSIS — T63463A Toxic effect of venom of wasps, assault, initial encounter: Secondary | ICD-10-CM | POA: Diagnosis not present

## 2023-12-17 DIAGNOSIS — Z1231 Encounter for screening mammogram for malignant neoplasm of breast: Secondary | ICD-10-CM | POA: Diagnosis not present

## 2023-12-17 LAB — HM MAMMOGRAPHY

## 2023-12-22 ENCOUNTER — Encounter: Payer: Self-pay | Admitting: Family Medicine

## 2023-12-22 DIAGNOSIS — G20A1 Parkinson's disease without dyskinesia, without mention of fluctuations: Secondary | ICD-10-CM | POA: Diagnosis not present

## 2023-12-29 DIAGNOSIS — N950 Postmenopausal bleeding: Secondary | ICD-10-CM | POA: Diagnosis not present

## 2024-01-20 DIAGNOSIS — N950 Postmenopausal bleeding: Secondary | ICD-10-CM | POA: Diagnosis not present

## 2024-01-20 DIAGNOSIS — R9389 Abnormal findings on diagnostic imaging of other specified body structures: Secondary | ICD-10-CM | POA: Diagnosis not present

## 2024-01-28 ENCOUNTER — Encounter: Payer: Self-pay | Admitting: Family Medicine

## 2024-01-30 MED ORDER — COVID-19 MRNA VAC-TRIS(PFIZER) 30 MCG/0.3ML IM SUSY: 0.3000 mL | PREFILLED_SYRINGE | Freq: Once | INTRAMUSCULAR | 0 refills | Status: AC

## 2024-02-18 DIAGNOSIS — I4510 Unspecified right bundle-branch block: Secondary | ICD-10-CM | POA: Diagnosis not present

## 2024-02-18 DIAGNOSIS — R9389 Abnormal findings on diagnostic imaging of other specified body structures: Secondary | ICD-10-CM | POA: Diagnosis not present

## 2024-02-18 DIAGNOSIS — N950 Postmenopausal bleeding: Secondary | ICD-10-CM | POA: Diagnosis not present

## 2024-02-18 DIAGNOSIS — R001 Bradycardia, unspecified: Secondary | ICD-10-CM | POA: Diagnosis not present

## 2024-02-18 DIAGNOSIS — Z79890 Hormone replacement therapy (postmenopausal): Secondary | ICD-10-CM | POA: Diagnosis not present

## 2024-02-18 DIAGNOSIS — Z01818 Encounter for other preprocedural examination: Secondary | ICD-10-CM | POA: Diagnosis not present

## 2024-02-18 DIAGNOSIS — Z780 Asymptomatic menopausal state: Secondary | ICD-10-CM | POA: Diagnosis not present

## 2024-02-19 DIAGNOSIS — R001 Bradycardia, unspecified: Secondary | ICD-10-CM | POA: Diagnosis not present

## 2024-02-19 DIAGNOSIS — I4510 Unspecified right bundle-branch block: Secondary | ICD-10-CM | POA: Diagnosis not present

## 2024-02-25 DIAGNOSIS — K219 Gastro-esophageal reflux disease without esophagitis: Secondary | ICD-10-CM | POA: Diagnosis not present

## 2024-02-25 DIAGNOSIS — Z79899 Other long term (current) drug therapy: Secondary | ICD-10-CM | POA: Diagnosis not present

## 2024-02-25 DIAGNOSIS — Z79890 Hormone replacement therapy (postmenopausal): Secondary | ICD-10-CM | POA: Diagnosis not present

## 2024-02-25 DIAGNOSIS — N950 Postmenopausal bleeding: Secondary | ICD-10-CM | POA: Diagnosis not present

## 2024-02-25 DIAGNOSIS — M159 Polyosteoarthritis, unspecified: Secondary | ICD-10-CM | POA: Diagnosis not present

## 2024-03-02 DIAGNOSIS — D1801 Hemangioma of skin and subcutaneous tissue: Secondary | ICD-10-CM | POA: Diagnosis not present

## 2024-03-02 DIAGNOSIS — D692 Other nonthrombocytopenic purpura: Secondary | ICD-10-CM | POA: Diagnosis not present

## 2024-03-02 DIAGNOSIS — L821 Other seborrheic keratosis: Secondary | ICD-10-CM | POA: Diagnosis not present

## 2024-03-02 DIAGNOSIS — D235 Other benign neoplasm of skin of trunk: Secondary | ICD-10-CM | POA: Diagnosis not present

## 2024-03-02 DIAGNOSIS — L578 Other skin changes due to chronic exposure to nonionizing radiation: Secondary | ICD-10-CM | POA: Diagnosis not present

## 2024-03-02 DIAGNOSIS — L570 Actinic keratosis: Secondary | ICD-10-CM | POA: Diagnosis not present

## 2024-03-02 DIAGNOSIS — L814 Other melanin hyperpigmentation: Secondary | ICD-10-CM | POA: Diagnosis not present

## 2024-03-02 DIAGNOSIS — Z08 Encounter for follow-up examination after completed treatment for malignant neoplasm: Secondary | ICD-10-CM | POA: Diagnosis not present

## 2024-03-02 DIAGNOSIS — Z85828 Personal history of other malignant neoplasm of skin: Secondary | ICD-10-CM | POA: Diagnosis not present

## 2024-03-04 ENCOUNTER — Emergency Department (HOSPITAL_BASED_OUTPATIENT_CLINIC_OR_DEPARTMENT_OTHER)

## 2024-03-04 ENCOUNTER — Other Ambulatory Visit: Payer: Self-pay

## 2024-03-04 ENCOUNTER — Emergency Department (HOSPITAL_BASED_OUTPATIENT_CLINIC_OR_DEPARTMENT_OTHER)
Admission: EM | Admit: 2024-03-04 | Discharge: 2024-03-04 | Disposition: A | Discharge status: Home / Self Care | Attending: Emergency Medicine | Admitting: Emergency Medicine

## 2024-03-04 ENCOUNTER — Encounter (HOSPITAL_BASED_OUTPATIENT_CLINIC_OR_DEPARTMENT_OTHER): Payer: Self-pay | Admitting: Emergency Medicine

## 2024-03-04 DIAGNOSIS — R220 Localized swelling, mass and lump, head: Secondary | ICD-10-CM | POA: Diagnosis not present

## 2024-03-04 DIAGNOSIS — M47812 Spondylosis without myelopathy or radiculopathy, cervical region: Secondary | ICD-10-CM | POA: Diagnosis not present

## 2024-03-04 DIAGNOSIS — S59911A Unspecified injury of right forearm, initial encounter: Secondary | ICD-10-CM | POA: Diagnosis not present

## 2024-03-04 DIAGNOSIS — W19XXXA Unspecified fall, initial encounter: Secondary | ICD-10-CM

## 2024-03-04 DIAGNOSIS — S01111A Laceration without foreign body of right eyelid and periocular area, initial encounter: Secondary | ICD-10-CM | POA: Insufficient documentation

## 2024-03-04 DIAGNOSIS — M1711 Unilateral primary osteoarthritis, right knee: Secondary | ICD-10-CM | POA: Insufficient documentation

## 2024-03-04 DIAGNOSIS — M4312 Spondylolisthesis, cervical region: Secondary | ICD-10-CM | POA: Diagnosis not present

## 2024-03-04 DIAGNOSIS — W1830XA Fall on same level, unspecified, initial encounter: Secondary | ICD-10-CM | POA: Insufficient documentation

## 2024-03-04 DIAGNOSIS — Y92017 Garden or yard in single-family (private) house as the place of occurrence of the external cause: Secondary | ICD-10-CM | POA: Diagnosis not present

## 2024-03-04 DIAGNOSIS — E785 Hyperlipidemia, unspecified: Secondary | ICD-10-CM | POA: Diagnosis not present

## 2024-03-04 DIAGNOSIS — G9389 Other specified disorders of brain: Secondary | ICD-10-CM | POA: Diagnosis not present

## 2024-03-04 DIAGNOSIS — S61411A Laceration without foreign body of right hand, initial encounter: Secondary | ICD-10-CM | POA: Insufficient documentation

## 2024-03-04 DIAGNOSIS — M4802 Spinal stenosis, cervical region: Secondary | ICD-10-CM | POA: Diagnosis not present

## 2024-03-04 DIAGNOSIS — S0990XA Unspecified injury of head, initial encounter: Secondary | ICD-10-CM | POA: Diagnosis not present

## 2024-03-04 DIAGNOSIS — M79641 Pain in right hand: Secondary | ICD-10-CM | POA: Diagnosis present

## 2024-03-04 DIAGNOSIS — Z043 Encounter for examination and observation following other accident: Secondary | ICD-10-CM | POA: Diagnosis not present

## 2024-03-04 DIAGNOSIS — S81011A Laceration without foreign body, right knee, initial encounter: Secondary | ICD-10-CM | POA: Diagnosis not present

## 2024-03-04 MED ORDER — HYDROCODONE-ACETAMINOPHEN 5-325 MG PO TABS
1.0000 | ORAL_TABLET | Freq: Once | ORAL | Status: AC
  Administered 2024-03-04: 1 via ORAL
  Filled 2024-03-04: qty 1

## 2024-03-04 MED ORDER — LIDOCAINE HCL (PF) 1 % IJ SOLN
20.0000 mL | Freq: Once | INTRAMUSCULAR | Status: AC
  Administered 2024-03-04: 20 mL
  Filled 2024-03-04: qty 20

## 2024-03-04 MED ORDER — TETANUS-DIPHTH-ACELL PERTUSSIS 5-2-15.5 LF-MCG/0.5 IM SUSP
0.5000 mL | Freq: Once | INTRAMUSCULAR | Status: AC
  Administered 2024-03-04: 0.5 mL via INTRAMUSCULAR
  Filled 2024-03-04: qty 0.5

## 2024-03-04 MED ORDER — HYDROCODONE-ACETAMINOPHEN 5-325 MG PO TABS: 1.0000 | ORAL_TABLET | Freq: Four times a day (QID) | ORAL | 0 refills | Status: DC | PRN

## 2024-03-04 MED ORDER — CEPHALEXIN 500 MG PO CAPS: 500.0000 mg | ORAL_CAPSULE | Freq: Four times a day (QID) | ORAL | 0 refills | Status: DC

## 2024-03-04 NOTE — ED Triage Notes (Signed)
 Pt reports mechanical fall this today, landed concrete.  Denies LOC, blood thinners.   Pt with laceration to R hand, wrist, R temple, R knee.

## 2024-03-04 NOTE — ED Notes (Signed)
Pt transported to imaging at this time

## 2024-03-04 NOTE — Discharge Instructions (Addendum)
 It was a pleasure taking care of you today.  As discussed, please call the hand surgeon tomorrow to schedule an appointment for further evaluation.  I am sending you home with pain medication and an antibiotic.  Take antibiotic as prescribed. Return to the ER for new or worsening symptoms.

## 2024-03-04 NOTE — ED Provider Notes (Signed)
 [place] [facility] AT [facility] Provider Note   CSN: [id number] Arrival date & time: 03/04/24  1243     Patient presents with: Vickie Lopez is a 78 y.o. female with a past medical history significant for hyperlipidemia, history of tremors, and Barrett's esophagus who presents to the ED after a mechanical fall.  Patient was fixing a solar light in her yard and lost her balance and fell over on concrete.  Admits to hitting her head.  No LOC.  Not on any blood thinners.  Patient admits to pain throughout her right hand which sustained a laceration.  Also has small lacerations above right eye and right knee.  Patient is right-hand dominant.  Denies any visual or speech changes.  No nausea or vomiting.  Unsure when her last tetanus shot was.  History obtained from patient and past medical records. No interpreter used during encounter.      Prior to Admission medications   Medication Sig Start Date End Date Taking? Authorizing Provider  cephALEXin (KEFLEX) 500 MG capsule Take 1 capsule (500 mg total) by mouth 4 (four) times daily. 03/04/24  Yes Kyiah Canepa C, PA-C  HYDROcodone -acetaminophen  (NORCO/VICODIN) 5-325 MG tablet Take 1 tablet by mouth every 6 (six) hours as needed. 03/04/24  Yes Zabdi Mis C, PA-C  carbidopa-levodopa (SINEMET IR) 25-100 MG tablet One p.o. TID with meals 06/23/23   [provider]  estradiol (ESTRACE) 2 MG tablet Take 2 mg by mouth daily.    [provider]  medroxyPROGESTERone (PROVERA) 2.5 MG tablet Take 2.5 mg by mouth daily.    [provider]  omeprazole  (PRILOSEC) 20 MG capsule Take 1 capsule (20 mg total) by mouth daily. Please call [phone] to schedule an office visit for more refills 02/07/23   Charlanne Groom, MD  propranolol  ER (INDERAL  LA) 60 MG 24 hr capsule Take 1 capsule (60 mg total) by mouth daily. 07/25/23   Frann Mabel Mt, DO  rosuvastatin  (CRESTOR ) 20 MG tablet Take 1  tablet (20 mg total) by mouth daily. 05/12/23   Frann Mabel Mt, DO  terbinafine  (LAMISIL ) 250 MG tablet TAKE 1 TABLET BY MOUTH EVERY DAY 08/25/23   Frann Mabel Mt, DO    Allergies: Patient has no known allergies.    Review of Systems  Eyes:  Negative for visual disturbance.  Gastrointestinal:  Negative for abdominal pain.  Musculoskeletal:  Positive for arthralgias.  Skin:  Positive for wound.  Neurological:  Negative for dizziness, speech difficulty and headaches.    Updated Vital Signs BP 113/70 (BP Location: Left Arm)   Pulse (!) 57   Temp 98 F (36.7 C) (Oral)   Resp 16   Ht 5' 2 (1.575 m)   Wt 61.2 kg   SpO2 99%   BMI 24.69 kg/m   Physical Exam Vitals and nursing note reviewed.  Constitutional:      General: She is not in acute distress.    Appearance: She is not ill-appearing.  HENT:     Head: Normocephalic.  Eyes:     Pupils: Pupils are equal, round, and reactive to light.     Comments: Superficial laceration above right eyebrow. EOMs intact.   Neck:     Comments: No cervical midline tenderness Cardiovascular:     Rate and Rhythm: Normal rate and regular rhythm.     Pulses: Normal pulses.     Heart sounds: Normal heart sounds. No murmur heard.    No friction rub.  No gallop.  Pulmonary:     Effort: Pulmonary effort is normal.     Breath sounds: Normal breath sounds.  Abdominal:     General: Abdomen is flat. There is no distension.     Palpations: Abdomen is soft.     Tenderness: There is no abdominal tenderness. There is no guarding or rebound.  Musculoskeletal:        General: Normal range of motion.     Cervical back: Neck supple.     Comments: No thoracic or lumbar midline tenderness. Full ROM of all right fingers and right wrist. Radial pulse intact  Skin:    General: Skin is warm and dry.     Comments: 12cm laceration to right arm from forearm to 5th finger.   Neurological:     General: No focal deficit present.     Mental Status:  She is alert.  Psychiatric:        Mood and Affect: Mood normal.        Behavior: Behavior normal.         (all labs ordered are listed, but only abnormal results are displayed) Labs Reviewed - No data to display  EKG: None  Radiology: DG Knee Complete 4 Views Right Result Date: 03/04/2024 CLINICAL DATA:  Fall, right knee laceration EXAM: RIGHT KNEE - COMPLETE 4+ VIEW COMPARISON:  None Available. FINDINGS: Mild spurring of the tibial spine. Mild marginal spurring in the patella and medial compartment. Medial compartmental articular space narrowing compatible with osteoarthritis. No fracture or acute bony findings identified.  No knee effusion. IMPRESSION: 1. Mild osteoarthritis. No acute findings. Electronically Signed   By: Ryan Salvage M.D.   On: 03/04/2024 15:46   DG Forearm Right Result Date: 03/04/2024 CLINICAL DATA:  Right forearm injury EXAM: RIGHT FOREARM - 2 VIEW COMPARISON:  None Available. FINDINGS: No fracture or acute bony findings identified. Overall no significant abnormalities observed. IMPRESSION: 1. No significant abnormality identified. Electronically Signed   By: Ryan Salvage M.D.   On: 03/04/2024 15:45   CT Maxillofacial Wo Contrast Result Date: 03/04/2024 CLINICAL DATA:  Status post fall. EXAM: CT MAXILLOFACIAL WITHOUT CONTRAST TECHNIQUE: Multidetector CT imaging of the maxillofacial structures was performed. Multiplanar CT image reconstructions were also generated. RADIATION DOSE REDUCTION: This exam was performed according to the departmental dose-optimization program which includes automated exposure control, adjustment of the mA and/or kV according to patient size and/or use of iterative reconstruction technique. COMPARISON:  None Available. FINDINGS: Osseous: No fracture or mandibular dislocation. No destructive process. Orbits: Negative. No traumatic or inflammatory finding. Sinuses: Clear. Soft tissues: There is mild lateral right periorbital soft  tissue swelling. A small amount of soft tissue air is also seen within this region. Limited intracranial: No significant or unexpected finding. IMPRESSION: Mild lateral right periorbital soft tissue swelling without evidence of an acute fracture or mandibular dislocation. Electronically Signed   By: Suzen Dials M.D.   On: 03/04/2024 15:14   CT Cervical Spine Wo Contrast Result Date: 03/04/2024 CLINICAL DATA:  Status post fall. EXAM: CT CERVICAL SPINE WITHOUT CONTRAST TECHNIQUE: Multidetector CT imaging of the cervical spine was performed without intravenous contrast. Multiplanar CT image reconstructions were also generated. RADIATION DOSE REDUCTION: This exam was performed according to the departmental dose-optimization program which includes automated exposure control, adjustment of the mA and/or kV according to patient size and/or use of iterative reconstruction technique. COMPARISON:  None Available. FINDINGS: Alignment: There is 1 mm to 2 mm anterolisthesis of the C4 vertebral body  on C5. 1 mm retrolisthesis of C5 is noted on C6. Skull base and vertebrae: No acute fracture. No primary bone lesion or focal pathologic process. Soft tissues and spinal canal: No prevertebral fluid or swelling. No visible canal hematoma. Disc levels: Marked severity endplate sclerosis, anterior osteophyte formation and posterior bony spurring are seen at the levels of C5-C6 and C6-C7. Marked severity intervertebral disc space narrowing is present at C5-C6 and C6-C7. Bilateral moderate to marked severity multilevel facet joint hypertrophy is noted. Upper chest: There is mild right apical scarring and/or atelectasis. Other: None. IMPRESSION: 1. No acute fracture or traumatic subluxation. 2. Marked severity degenerative changes at the levels of C5-C6 and C6-C7. Electronically Signed   By: Suzen Dials M.D.   On: 03/04/2024 15:11   CT Head Wo Contrast Result Date: 03/04/2024 CLINICAL DATA:  Status post fall. EXAM: CT  HEAD WITHOUT CONTRAST TECHNIQUE: Contiguous axial images were obtained from the base of the skull through the vertex without intravenous contrast. RADIATION DOSE REDUCTION: This exam was performed according to the departmental dose-optimization program which includes automated exposure control, adjustment of the mA and/or kV according to patient size and/or use of iterative reconstruction technique. COMPARISON:  None Available. FINDINGS: Brain: There is generalized cerebral atrophy with widening of the extra-axial spaces and ventricular dilatation. There are areas of decreased attenuation within the white matter tracts of the supratentorial brain, consistent with microvascular disease changes. Vascular: No hyperdense vessel or unexpected calcification. Skull: Normal. Negative for fracture or focal lesion. Sinuses/Orbits: No acute finding. Other: There is mild lateral right periorbital soft tissue swelling. IMPRESSION: 1. Generalized cerebral atrophy and microvascular disease changes of the supratentorial brain. 2. No acute intracranial abnormality. 3. Mild lateral right periorbital soft tissue swelling. Electronically Signed   By: Suzen Dials M.D.   On: 03/04/2024 15:08   DG Hand Complete Right Result Date: 03/04/2024 EXAM: 3 or more VIEW(S) XRAY OF THE RIGHT HAND 03/04/2024 01:49:10 PM COMPARISON: None available. CLINICAL HISTORY: fall with injury. Large skin tear/laceration FINDINGS: BONES AND JOINTS: No acute fracture. No focal osseous lesion. No joint dislocation. SOFT TISSUES: Soft tissue swelling is noted medially in the hand with some small densities suggesting debris within the wound. IMPRESSION: 1. No acute osseous abnormality. 2. Soft tissue swelling along the medial hand with small radiopaque foreign bodies/debris within the wound. Electronically signed by: Lynwood Seip MD 03/04/2024 02:17 PM EDT RP Workstation: [id number]     .Laceration Repair  Date/Time: 03/04/2024 5:14 PM  Performed  by: Lorelle Aleck BROCKS, PA-C Authorized by: Lorelle Aleck BROCKS, PA-C   Consent:    Consent obtained:  Verbal   Consent given by:  Patient   Risks, benefits, and alternatives were discussed: yes     Risks discussed:  Poor cosmetic result, poor wound healing, infection and nerve damage   Alternatives discussed:  No treatment and delayed treatment Universal protocol:    Procedure explained and questions answered to patient or proxy's satisfaction: yes     Relevant documents present and verified: yes     Test results available: yes     Imaging studies available: yes     Required blood products, implants, devices, and special equipment available: yes     Site/side marked: yes     Immediately prior to procedure, a time out was called: yes     Patient identity confirmed:  Verbally with patient Anesthesia:    Anesthesia method:  Local infiltration   Local anesthetic:  Lidocaine 1% w/o epi Laceration  details:    Location:  Hand   Hand location:  R palm   Length (cm):  12   Depth (mm):  3 Pre-procedure details:    Preparation:  Patient was prepped and draped in usual sterile fashion and imaging obtained to evaluate for foreign bodies Exploration:    Limited defect created (wound extended): no     Hemostasis achieved with:  Direct pressure   Imaging obtained: x-ray     Imaging outcome: foreign body not noted     Wound exploration: wound explored through full range of motion and entire depth of wound visualized     Wound extent: no foreign body and no underlying fracture     Contaminated: no   Treatment:    Area cleansed with:  Saline   Amount of cleaning:  Extensive   Irrigation solution:  Sterile saline   Irrigation volume:  1L   Irrigation method:  Pressure wash   Visualized foreign bodies/material removed: no     Debridement:  None   Undermining:  None   Scar revision: no   Skin repair:    Repair method:  Sutures   Suture size:  4-0   Suture material:  Prolene   Suture  technique:  Simple interrupted   Number of sutures:  8 Approximation:    Approximation:  Close Repair type:    Repair type:  Complex Post-procedure details:    Dressing:  Bulky dressing and non-adherent dressing   Procedure completion:  Tolerated well, no immediate complications    Medications Ordered in the ED  HYDROcodone -acetaminophen  (NORCO/VICODIN) 5-325 MG per tablet 1 tablet (1 tablet Oral Given 03/04/24 1417)  lidocaine (PF) (XYLOCAINE) 1 % injection 20 mL (20 mLs Infiltration Given 03/04/24 1417)  Tdap (ADACEL) injection 0.5 mL (0.5 mLs Intramuscular Given 03/04/24 1417)                                    Medical Decision Making Amount and/or Complexity of Data Reviewed Independent Historian: caregiver Radiology: ordered and independent interpretation performed. Decision-making details documented in ED Course.  Risk Prescription drug management.   This patient presents to the ED for concern of fall, this involves an extensive number of treatment options, and is a complaint that carries with it a high risk of complications and morbidity.  The differential diagnosis includes intracranial bleed, bony fracture, dislocation, etc  78 year old female presents to the ED after a mechanical fall.  Fell on concrete while fixing a solar light in her yard.  No LOC.  Not on any blood thinners. Right hand dominant.  Upon arrival, stable vitals.  Patient in no acute distress.  Superficial laceration above right eyebrow and right knee. EOMs intact, low suspicion for entrapment. 12 cm laceration to right forearm that extends to proximal portion of right 5th finger. Unsure when her last tetanus shot was, so updated in the ED. X-rays ordered to rule out bony fractures. CT head, c-spine, and maxillofacial. Norco for pain.   CT images personally reviewed and interpreted which are negative for any acute abnormalities.  X-rays negative for bony fractures.    4:39 PM Discussed with Dr. Delene  with hand surgery who recommends placing sutures throughout laceration and then xeroform over skin tear. He recommends discharging patient with Keflex and follow-up in office.   Wound thoroughly cleaned. Suture repair as noted above. Bulky dressing place.  Patient discharged with an antibiotic and  pain medication.  Hand surgery number given to patient at discharge and advised to call to schedule an appointment for further evaluation.  Patient stable for discharge. Strict ED precautions discussed with patient. Patient states understanding and agrees to plan. Patient discharged home in no acute distress and stable vitals  Co morbidities that complicate the patient evaluation  hyperlipidemia  Social Determinants of Health:  Elderly >65   Discussed with Dr. Dean who evaluated patient at bedside and agrees with assessment and plan.    Final diagnoses:  Fall, initial encounter  Injury of head, initial encounter  Laceration of right hand without foreign body, initial encounter    ED Discharge Orders          Ordered    HYDROcodone -acetaminophen  (NORCO/VICODIN) 5-325 MG tablet  Every 6 hours PRN        03/04/24 1725    cephALEXin (KEFLEX) 500 MG capsule  4 times daily        03/04/24 1725               Marlowe Lawes C, PA-C 03/04/24 1727    Dean Clarity, MD 03/08/24 [phone]

## 2024-03-04 NOTE — ED Notes (Signed)
 Pt transferred from [facility] to ED RM 8. Assuming pt care at this time.

## 2024-03-05 DIAGNOSIS — S61421A Laceration with foreign body of right hand, initial encounter: Secondary | ICD-10-CM | POA: Diagnosis not present

## 2024-03-05 DIAGNOSIS — M79641 Pain in right hand: Secondary | ICD-10-CM | POA: Diagnosis not present

## 2024-03-19 DIAGNOSIS — S61421D Laceration with foreign body of right hand, subsequent encounter: Secondary | ICD-10-CM | POA: Diagnosis not present

## 2024-03-25 DIAGNOSIS — D3132 Benign neoplasm of left choroid: Secondary | ICD-10-CM | POA: Diagnosis not present

## 2024-04-02 DIAGNOSIS — S61421D Laceration with foreign body of right hand, subsequent encounter: Secondary | ICD-10-CM | POA: Diagnosis not present

## 2024-04-12 ENCOUNTER — Encounter: Payer: Self-pay | Admitting: Family Medicine

## 2024-04-12 MED ORDER — PRIMIDONE 50 MG PO TABS: 50.0000 mg | ORAL_TABLET | Freq: Every day | ORAL | 0 refills | Status: AC

## 2024-05-27 ENCOUNTER — Other Ambulatory Visit: Payer: Self-pay | Admitting: Family Medicine

## 2024-05-31 ENCOUNTER — Ambulatory Visit: Admitting: Family Medicine

## 2024-05-31 ENCOUNTER — Encounter: Payer: Self-pay | Admitting: Family Medicine

## 2024-05-31 VITALS — BP 122/74 | HR 64 | Temp 97.6°F | Resp 16 | Ht 62.0 in | Wt 137.8 lb

## 2024-05-31 DIAGNOSIS — M545 Low back pain, unspecified: Secondary | ICD-10-CM

## 2024-05-31 DIAGNOSIS — G8929 Other chronic pain: Secondary | ICD-10-CM

## 2024-05-31 DIAGNOSIS — R251 Tremor, unspecified: Secondary | ICD-10-CM

## 2024-05-31 DIAGNOSIS — E785 Hyperlipidemia, unspecified: Secondary | ICD-10-CM

## 2024-05-31 MED ORDER — ROSUVASTATIN CALCIUM 20 MG PO TABS: 20.0000 mg | ORAL_TABLET | Freq: Every day | ORAL | 3 refills | Status: AC

## 2024-05-31 NOTE — Patient Instructions (Addendum)
 Give us  2-3 business days to get the results of your labs back.   Heat (pad or rice pillow in microwave) over affected area, 10-15 minutes twice daily.   Ice/cold pack over area for 10-15 min twice daily.  OK to take Tylenol  1000 mg (2 extra strength tabs) or 975 mg (3 regular strength tabs) every 6 hours as needed.  Foods that may reduce pain: 1) Ginger 2) Blueberries 3) Salmon 4) Pumpkin seeds 5) Dark chocolate 6) Turmeric 7) Tart cherries 8) Virgin olive oil 9) Chili peppers 10) Mint 11) Krill oil  Let us  know if you need anything.  Piriformis Syndrome Rehab It is normal to feel mild stretching, pulling, tightness, or discomfort as you do these exercises, but you should stop right away if you feel sudden pain or your pain gets worse.   Stretching and range of motion exercises These exercises warm up your muscles and joints and improve the movement and flexibility of your hip and pelvis. These exercises also help to relieve pain, numbness, and tingling. Exercise A: Hip rotators    Lie on your back on a firm surface. Pull your left / right knee toward your same shoulder with your left / right hand until your knee is pointing toward the ceiling. Hold your left / right ankle with your other hand. Keeping your knee steady, gently pull your left / right ankle toward your other shoulder until you feel a stretch in your buttocks. Hold this position for 30 seconds. Repeat 2 times. Complete this stretch 3 times per week. Exercise B: Hip extensors Lie on your back on a firm surface. Both of your legs should be straight. Pull your left / right knee to your chest. Hold your leg in this position by holding onto the back of your thigh or the front of your knee. Hold this position for 30 seconds. Slowly return to the starting position. Repeat 2 times. Complete this stretch 3 times per week.  Strengthening exercises These exercises build strength and endurance in your hip and thigh  muscles. Endurance is the ability to use your muscles for a long time, even after they get tired. Exercise C: Straight leg raises (hip abductors)     Lie on your side with your left / right leg in the top position. Lie so your head, shoulder, knee, and hip line up. Bend your bottom knee to help you balance. Lift your top leg up 4-6 inches (10-15 cm), keeping your toes pointed straight ahead. Hold this position for 1 second. Slowly lower your leg to the starting position. Let your muscles relax completely. Repeat for a total of 10 repetitions. Repeat 2 times. Complete this exercise 3 times per week. Exercise D: Hip abductors and rotators, quadruped    Get on your hands and knees on a firm, lightly padded surface. Your hands should be directly below your shoulders, and your knees should be directly below your hips. Lift your left / right knee out to the side. Keep your knee bent. Do not twist your body. Hold this position for 1 seconds. Slowly lower your leg. Repeat for a total of 10 repetitions.  Repeat 1 times. Complete this exercise 3 times per week. Exercise E: Straight leg raises (hip extensors) Lie on your abdomen on a bed or a firm surface with a pillow under your hips. Squeeze your buttock muscles and lift your left / right thigh off the bed. Do not let your back arch. Hold this position for 3 seconds.  Slowly return to the starting position. Let your muscles relax completely before doing another repetition. Repeat 2 times. Complete this exercise 3 times per week.  This information is not intended to replace advice given to you by your health care provider. Make sure you discuss any questions you have with your health care provider. Document Released: 05/06/2005 Document Revised: 01/09/2016 Document Reviewed: 04/18/2015 [employer] Interactive Patient Education  2018 [employer]   EXERCISES  RANGE OF MOTION (ROM) AND STRETCHING EXERCISES - Low Back Pain Most people with lower back  pain will find that their symptoms get worse with excessive bending forward (flexion) or arching at the lower back (extension). The exercises that will help resolve your symptoms will focus on the opposite motion.  If you have pain, numbness or tingling which travels down into your buttocks, leg or foot, the goal of the therapy is for these symptoms to move closer to your back and eventually resolve. Sometimes, these leg symptoms will get better, but your lower back pain may worsen. This is often an indication of progress in your rehabilitation. Be very alert to any changes in your symptoms and the activities in which you participated in the 24 hours prior to the change. Sharing this information with your caregiver will allow him or her to most efficiently treat your condition. These exercises may help you when beginning to rehabilitate your injury. Your symptoms may resolve with or without further involvement from your physician, physical therapist or athletic trainer. While completing these exercises, remember:  Restoring tissue flexibility helps normal motion to return to the joints. This allows healthier, less painful movement and activity. An effective stretch should be held for at least 30 seconds. A stretch should never be painful. You should only feel a gentle lengthening or release in the stretched tissue. FLEXION RANGE OF MOTION AND STRETCHING EXERCISES:  STRETCH - Flexion, Single Knee to Chest  Lie on a firm bed or floor with both legs extended in front of you. Keeping one leg in contact with the floor, bring your opposite knee to your chest. Hold your leg in place by either grabbing behind your thigh or at your knee. Pull until you feel a gentle stretch in your low back. Hold 30 seconds. Slowly release your grasp and repeat the exercise with the opposite side. Repeat 2 times. Complete this exercise 3 times per week.   STRETCH - Flexion, Double Knee to Chest Lie on a firm bed or floor with  both legs extended in front of you. Keeping one leg in contact with the floor, bring your opposite knee to your chest. Tense your stomach muscles to support your back and then lift your other knee to your chest. Hold your legs in place by either grabbing behind your thighs or at your knees. Pull both knees toward your chest until you feel a gentle stretch in your low back. Hold 30 seconds. Tense your stomach muscles and slowly return one leg at a time to the floor. Repeat 2 times. Complete this exercise 3 times per week.   STRETCH - Low Trunk Rotation Lie on a firm bed or floor. Keeping your legs in front of you, bend your knees so they are both pointed toward the ceiling and your feet are flat on the floor. Extend your arms out to the side. This will stabilize your upper body by keeping your shoulders in contact with the floor. Gently and slowly drop both knees together to one side until you feel a gentle  stretch in your low back. Hold for 30 seconds. Tense your stomach muscles to support your lower back as you bring your knees back to the starting position. Repeat the exercise to the other side. Repeat 2 times. Complete this exercise at least 3 times per week.   EXTENSION RANGE OF MOTION AND FLEXIBILITY EXERCISES:  STRETCH - Extension, Prone on Elbows  Lie on your stomach on the floor, a bed will be too soft. Place your palms about shoulder width apart and at the height of your head. Place your elbows under your shoulders. If this is too painful, stack pillows under your chest. Allow your body to relax so that your hips drop lower and make contact more completely with the floor. Hold this position for 30 seconds. Slowly return to lying flat on the floor. Repeat 2 times. Complete this exercise 3 times per week.   RANGE OF MOTION - Extension, Prone Press Ups Lie on your stomach on the floor, a bed will be too soft. Place your palms about shoulder width apart and at the height of your  head. Keeping your back as relaxed as possible, slowly straighten your elbows while keeping your hips on the floor. You may adjust the placement of your hands to maximize your comfort. As you gain motion, your hands will come more underneath your shoulders. Hold this position 30 seconds. Slowly return to lying flat on the floor. Repeat 2 times. Complete this exercise 3 times per week.   RANGE OF MOTION- Quadruped, Neutral Spine  Assume a hands and knees position on a firm surface. Keep your hands under your shoulders and your knees under your hips. You may place padding under your knees for comfort. Drop your head and point your tailbone toward the ground below you. This will round out your lower back like an angry cat. Hold this position for 30 seconds. Slowly lift your head and release your tail bone so that your back sags into a large arch, like an old horse. Hold this position for 30 seconds. Repeat this until you feel limber in your low back. Now, find your sweet spot. This will be the most comfortable position somewhere between the two previous positions. This is your neutral spine. Once you have found this position, tense your stomach muscles to support your low back. Hold this position for 30 seconds. Repeat 2 times. Complete this exercise 3 times per week.   STRENGTHENING EXERCISES - Low Back Sprain These exercises may help you when beginning to rehabilitate your injury. These exercises should be done near your sweet spot. This is the neutral, low-back arch, somewhere between fully rounded and fully arched, that is your least painful position. When performed in this safe range of motion, these exercises can be used for people who have either a flexion or extension based injury. These exercises may resolve your symptoms with or without further involvement from your physician, physical therapist or athletic trainer. While completing these exercises, remember:  Muscles can gain both the  endurance and the strength needed for everyday activities through controlled exercises. Complete these exercises as instructed by your physician, physical therapist or athletic trainer. Increase the resistance and repetitions only as guided. You may experience muscle soreness or fatigue, but the pain or discomfort you are trying to eliminate should never worsen during these exercises. If this pain does worsen, stop and make certain you are following the directions exactly. If the pain is still present after adjustments, discontinue the exercise until you can  discuss the trouble with your caregiver.  STRENGTHENING - Deep Abdominals, Pelvic Tilt  Lie on a firm bed or floor. Keeping your legs in front of you, bend your knees so they are both pointed toward the ceiling and your feet are flat on the floor. Tense your lower abdominal muscles to press your low back into the floor. This motion will rotate your pelvis so that your tail bone is scooping upwards rather than pointing at your feet or into the floor. With a gentle tension and even breathing, hold this position for 3 seconds. Repeat 2 times. Complete this exercise 3 times per week.   STRENGTHENING - Abdominals, Crunches  Lie on a firm bed or floor. Keeping your legs in front of you, bend your knees so they are both pointed toward the ceiling and your feet are flat on the floor. Cross your arms over your chest. Slightly tip your chin down without bending your neck. Tense your abdominals and slowly lift your trunk high enough to just clear your shoulder blades. Lifting higher can put excessive stress on the lower back and does not further strengthen your abdominal muscles. Control your return to the starting position. Repeat 2 times. Complete this exercise 3 times per week.   STRENGTHENING - Quadruped, Opposite UE/LE Lift  Assume a hands and knees position on a firm surface. Keep your hands under your shoulders and your knees under your hips. You  may place padding under your knees for comfort. Find your neutral spine and gently tense your abdominal muscles so that you can maintain this position. Your shoulders and hips should form a rectangle that is parallel with the floor and is not twisted. Keeping your trunk steady, lift your right hand no higher than your shoulder and then your left leg no higher than your hip. Make sure you are not holding your breath. Hold this position for 30 seconds. Continuing to keep your abdominal muscles tense and your back steady, slowly return to your starting position. Repeat with the opposite arm and leg. Repeat 2 times. Complete this exercise 3 times per week.   STRENGTHENING - Abdominals and Quadriceps, Straight Leg Raise  Lie on a firm bed or floor with both legs extended in front of you. Keeping one leg in contact with the floor, bend the other knee so that your foot can rest flat on the floor. Find your neutral spine, and tense your abdominal muscles to maintain your spinal position throughout the exercise. Slowly lift your straight leg off the floor about 6 inches for a count of 3, making sure to not hold your breath. Still keeping your neutral spine, slowly lower your leg all the way to the floor. Repeat this exercise with each leg 2 times. Complete this exercise 3 times per week.  POSTURE AND BODY MECHANICS CONSIDERATIONS - Low Back Sprain Keeping correct posture when sitting, standing or completing your activities will reduce the stress put on different body tissues, allowing injured tissues a chance to heal and limiting painful experiences. The following are general guidelines for improved posture.  While reading these guidelines, remember: The exercises prescribed by your provider will help you have the flexibility and strength to maintain correct postures. The correct posture provides the best environment for your joints to work. All of your joints have less wear and tear when properly supported  by a spine with good posture. This means you will experience a healthier, less painful body. Correct posture must be practiced with all of your  activities, especially prolonged sitting and standing. Correct posture is as important when doing repetitive low-stress activities (typing) as it is when doing a single heavy-load activity (lifting).  RESTING POSITIONS Consider which positions are most painful for you when choosing a resting position. If you have pain with flexion-based activities (sitting, bending, stooping, squatting), choose a position that allows you to rest in a less flexed posture. You would want to avoid curling into a fetal position on your side. If your pain worsens with extension-based activities (prolonged standing, working overhead), avoid resting in an extended position such as sleeping on your stomach. Most people will find more comfort when they rest with their spine in a more neutral position, neither too rounded nor too arched. Lying on a non-sagging bed on your side with a pillow between your knees, or on your back with a pillow under your knees will often provide some relief. Keep in mind, being in any one position for a prolonged period of time, no matter how correct your posture, can still lead to stiffness.  PROPER SITTING POSTURE In order to minimize stress and discomfort on your spine, you must sit with correct posture. Sitting with good posture should be effortless for a healthy body. Returning to good posture is a gradual process. Many people can work toward this most comfortably by using various supports until they have the flexibility and strength to maintain this posture on their own. When sitting with proper posture, your ears will fall over your shoulders and your shoulders will fall over your hips. You should use the back of the chair to support your upper back. Your lower back will be in a neutral position, just slightly arched. You may place a small pillow or folded  towel at the base of your lower back for  support.  When working at a desk, create an environment that supports good, upright posture. Without extra support, muscles tire, which leads to excessive strain on joints and other tissues. Keep these recommendations in mind:  CHAIR: A chair should be able to slide under your desk when your back makes contact with the back of the chair. This allows you to work closely. The chair's height should allow your eyes to be level with the upper part of your monitor and your hands to be slightly lower than your elbows.  BODY POSITION Your feet should make contact with the floor. If this is not possible, use a foot rest. Keep your ears over your shoulders. This will reduce stress on your neck and low back.  INCORRECT SITTING POSTURES  If you are feeling tired and unable to assume a healthy sitting posture, do not slouch or slump. This puts excessive strain on your back tissues, causing more damage and pain. Healthier options include: Using more support, like a lumbar pillow. Switching tasks to something that requires you to be upright or walking. Talking a brief walk. Lying down to rest in a neutral-spine position.  PROLONGED STANDING WHILE SLIGHTLY LEANING FORWARD  When completing a task that requires you to lean forward while standing in one place for a long time, place either foot up on a stationary 2-4 inch high object to help maintain the best posture. When both feet are on the ground, the lower back tends to lose its slight inward curve. If this curve flattens (or becomes too large), then the back and your other joints will experience too much stress, tire more quickly, and can cause pain.  CORRECT STANDING POSTURES Proper standing  posture should be assumed with all daily activities, even if they only take a few moments, like when brushing your teeth. As in sitting, your ears should fall over your shoulders and your shoulders should fall over your hips.  You should keep a slight tension in your abdominal muscles to brace your spine. Your tailbone should point down to the ground, not behind your body, resulting in an over-extended swayback posture.   INCORRECT STANDING POSTURES  Common incorrect standing postures include a forward head, locked knees and/or an excessive swayback. WALKING Walk with an upright posture. Your ears, shoulders and hips should all line-up.  PROLONGED ACTIVITY IN A FLEXED POSITION When completing a task that requires you to bend forward at your waist or lean over a low surface, try to find a way to stabilize 3 out of 4 of your limbs. You can place a hand or elbow on your thigh or rest a knee on the surface you are reaching across. This will provide you more stability, so that your muscles do not tire as quickly. By keeping your knees relaxed, or slightly bent, you will also reduce stress across your lower back. CORRECT LIFTING TECHNIQUES  DO : Assume a wide stance. This will provide you more stability and the opportunity to get as close as possible to the object which you are lifting. Tense your abdominals to brace your spine. Bend at the knees and hips. Keeping your back locked in a neutral-spine position, lift using your leg muscles. Lift with your legs, keeping your back straight. Test the weight of unknown objects before attempting to lift them. Try to keep your elbows locked down at your sides in order get the best strength from your shoulders when carrying an object.   Always ask for help when lifting heavy or awkward objects. INCORRECT LIFTING TECHNIQUES DO NOT:  Lock your knees when lifting, even if it is a small object. Bend and twist. Pivot at your feet or move your feet when needing to change directions. Assume that you can safely pick up even a paperclip without proper posture.

## 2024-05-31 NOTE — Progress Notes (Signed)
 Chief Complaint  Patient presents with   Follow-up    Follow Up    Subjective: Hyperlipidemia Patient presents for Hyperlipidemia follow up.  She is here with her daughter who helps with the history. Currently taking Crestor  20 mg/d and compliance with treatment thus far has been good. She denies myalgias. She is adhering to a healthy diet. Exercise: walking, wt lifting, golf No CP or SOB. The patient is not known to have coexisting coronary artery disease.  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 She started to have shaking of her head a few mo ago. She has an intention tremor which improved on primidone  50 mg qhs and Inderal  60 mg/d. Compliant, no AE's. Head shaking not bothersome enough to make a med change.  Chronic back pain radiating down both legs. Worse in AM. Went thru PT without sig improvement, does not wish to try again. Does not stretch routinely.   Past Medical History:  Diagnosis Date   Barrett's esophagus    Hyperlipidemia     Objective: BP 122/74 (BP Location: Left Arm, Patient Position: Sitting)   Pulse 64   Temp 97.6 F (36.4 C) (Oral)   Resp 16   Ht 5' 2 (1.575 m)   Wt 137 lb 12.8 oz (62.5 kg)   SpO2 95%   BMI 25.20 kg/m  General: Awake, appears stated age Heart: RRR, no LE edema, no bruits Lungs: CTAB, no rales, wheezes or rhonchi. No accessory muscle use MSK: No ttp in lumbar region or SI jt Neuro: +Resting tremor of head; no intention tremor; 5/5 strength throughout; DTR's equal and symmetric throughout, no clonus, gait is cautious Psych: Age appropriate judgment and insight, normal affect and mood  Assessment and Plan: Hyperlipidemia, unspecified hyperlipidemia type - Plan: Lipid panel, Comprehensive metabolic panel with GFR  Tremor  Chronic bilateral low back pain without sciatica  Chronic, stable. Cont Crestor  40 mg daily.  Counseled on diet and exercise.  Check labs. Chronic, stable.  Appreciate neurology input for the  Sinemet.  She will continue Inderal  LA 60 mg daily, primidone  50 mg nightly.  Offered to change medication but her current symptoms are not bothersome enough to do so. Heat, ice, Tylenol , stretches and exercises.  Could consider imaging as she has been through physical therapy.  Could consider referral to orthospine.  She will let me know in a month if not improving and we will offer such. F/u in 6 months. The patient and her daughter voiced understanding and agreement to the plan.  Mabel [place], DO 05/31/2024  4:38 PM

## 2024-06-01 ENCOUNTER — Ambulatory Visit: Payer: Self-pay | Admitting: Family Medicine

## 2024-06-01 LAB — COMPREHENSIVE METABOLIC PANEL WITH GFR
ALT: 4 U/L (ref 3–35)
AST: 14 U/L (ref 5–37)
Albumin: 4.2 g/dL (ref 3.5–5.2)
Alkaline Phosphatase: 54 U/L (ref 39–117)
BUN: 11 mg/dL (ref 6–23)
CO2: 26 meq/L (ref 19–32)
Calcium: 9.1 mg/dL (ref 8.4–10.5)
Chloride: 105 meq/L (ref 96–112)
Creatinine, Ser: 1.1 mg/dL (ref 0.40–1.20)
GFR: 48.6 mL/min — ABNORMAL LOW
Glucose, Bld: 98 mg/dL (ref 70–99)
Potassium: 4.1 meq/L (ref 3.5–5.1)
Sodium: 137 meq/L (ref 135–145)
Total Bilirubin: 0.3 mg/dL (ref 0.2–1.2)
Total Protein: 7 g/dL (ref 6.0–8.3)

## 2024-06-01 LAB — LIPID PANEL
Cholesterol: 174 mg/dL (ref 28–200)
HDL: 75.9 mg/dL
LDL Cholesterol: 77 mg/dL (ref 10–99)
NonHDL: 97.85
Total CHOL/HDL Ratio: 2
Triglycerides: 106 mg/dL (ref 10.0–149.0)
VLDL: 21.2 mg/dL (ref 0.0–40.0)

## 2024-06-23 ENCOUNTER — Ambulatory Visit
